# Patient Record
Sex: Female | Born: 1950 | Race: White | Hispanic: No | Marital: Married | Smoking: Never smoker
Health system: Southern US, Community
[De-identification: ages and names within clinical notes are randomized; demographics above are authoritative.]

## PROBLEM LIST (undated history)

## (undated) DIAGNOSIS — Z8739 Personal history of other diseases of the musculoskeletal system and connective tissue: Secondary | ICD-10-CM

## (undated) DIAGNOSIS — H8109 Meniere's disease, unspecified ear: Secondary | ICD-10-CM

## (undated) DIAGNOSIS — G510 Bell's palsy: Secondary | ICD-10-CM

## (undated) DIAGNOSIS — H9319 Tinnitus, unspecified ear: Secondary | ICD-10-CM

## (undated) DIAGNOSIS — M1990 Unspecified osteoarthritis, unspecified site: Secondary | ICD-10-CM

## (undated) DIAGNOSIS — S92901A Unspecified fracture of right foot, initial encounter for closed fracture: Secondary | ICD-10-CM

## (undated) DIAGNOSIS — G709 Myoneural disorder, unspecified: Secondary | ICD-10-CM

## (undated) DIAGNOSIS — R42 Dizziness and giddiness: Secondary | ICD-10-CM

## (undated) DIAGNOSIS — M810 Age-related osteoporosis without current pathological fracture: Secondary | ICD-10-CM

## (undated) DIAGNOSIS — G540 Brachial plexus disorders: Secondary | ICD-10-CM

## (undated) HISTORY — PX: BREAST CYST ASPIRATION: SHX578

## (undated) HISTORY — DX: Age-related osteoporosis without current pathological fracture: M81.0

## (undated) HISTORY — DX: Unspecified fracture of right foot, initial encounter for closed fracture: S92.901A

## (undated) HISTORY — PX: TONSILLECTOMY: SUR1361

## (undated) HISTORY — DX: Brachial plexus disorders: G54.0

## (undated) HISTORY — PX: WISDOM TOOTH EXTRACTION: SHX21

## (undated) HISTORY — DX: Bell's palsy: G51.0

## (undated) HISTORY — DX: Dizziness and giddiness: R42

## (undated) HISTORY — DX: Meniere's disease, unspecified ear: H81.09

---

## 1981-06-14 HISTORY — PX: HERNIA REPAIR: SHX51

## 1990-06-14 DIAGNOSIS — G540 Brachial plexus disorders: Secondary | ICD-10-CM

## 1990-06-14 HISTORY — DX: Brachial plexus disorders: G54.0

## 1993-06-14 DIAGNOSIS — G709 Myoneural disorder, unspecified: Secondary | ICD-10-CM

## 1993-06-14 DIAGNOSIS — S92901A Unspecified fracture of right foot, initial encounter for closed fracture: Secondary | ICD-10-CM

## 1993-06-14 HISTORY — DX: Myoneural disorder, unspecified: G70.9

## 1993-06-14 HISTORY — DX: Unspecified fracture of right foot, initial encounter for closed fracture: S92.901A

## 1999-04-09 ENCOUNTER — Encounter: Payer: Self-pay | Admitting: Obstetrics and Gynecology

## 1999-04-09 ENCOUNTER — Encounter: Admission: RE | Admit: 1999-04-09 | Discharge: 1999-04-09 | Payer: Self-pay | Admitting: Obstetrics and Gynecology

## 1999-08-20 ENCOUNTER — Encounter: Admission: RE | Admit: 1999-08-20 | Discharge: 1999-08-20 | Payer: Self-pay | Admitting: Obstetrics and Gynecology

## 1999-08-20 ENCOUNTER — Encounter: Payer: Self-pay | Admitting: Obstetrics and Gynecology

## 1999-09-29 ENCOUNTER — Encounter: Payer: Self-pay | Admitting: Otolaryngology

## 1999-09-29 ENCOUNTER — Encounter: Admission: RE | Admit: 1999-09-29 | Discharge: 1999-09-29 | Payer: Self-pay | Admitting: Otolaryngology

## 2001-04-13 ENCOUNTER — Encounter: Admission: RE | Admit: 2001-04-13 | Discharge: 2001-04-13 | Payer: Self-pay | Admitting: Obstetrics and Gynecology

## 2001-04-13 ENCOUNTER — Encounter: Payer: Self-pay | Admitting: Obstetrics and Gynecology

## 2001-06-14 HISTORY — PX: WRIST FRACTURE SURGERY: SHX121

## 2002-06-14 HISTORY — PX: ORIF TIBIA FRACTURE: SHX5416

## 2003-04-08 ENCOUNTER — Encounter: Payer: Self-pay | Admitting: Obstetrics and Gynecology

## 2003-04-08 ENCOUNTER — Encounter: Admission: RE | Admit: 2003-04-08 | Discharge: 2003-04-08 | Payer: Self-pay | Admitting: Obstetrics and Gynecology

## 2004-04-10 ENCOUNTER — Encounter: Admission: RE | Admit: 2004-04-10 | Discharge: 2004-04-10 | Payer: Self-pay | Admitting: Obstetrics and Gynecology

## 2005-05-28 ENCOUNTER — Encounter: Admission: RE | Admit: 2005-05-28 | Discharge: 2005-05-28 | Payer: Self-pay | Admitting: Obstetrics and Gynecology

## 2006-07-22 ENCOUNTER — Encounter: Admission: RE | Admit: 2006-07-22 | Discharge: 2006-07-22 | Payer: Self-pay | Admitting: Gastroenterology

## 2008-05-15 ENCOUNTER — Ambulatory Visit: Payer: Self-pay | Admitting: Pulmonary Disease

## 2008-05-15 DIAGNOSIS — R05 Cough: Secondary | ICD-10-CM

## 2008-05-15 DIAGNOSIS — J309 Allergic rhinitis, unspecified: Secondary | ICD-10-CM | POA: Insufficient documentation

## 2008-05-15 DIAGNOSIS — E785 Hyperlipidemia, unspecified: Secondary | ICD-10-CM | POA: Insufficient documentation

## 2008-05-15 DIAGNOSIS — R059 Cough, unspecified: Secondary | ICD-10-CM | POA: Insufficient documentation

## 2008-05-29 ENCOUNTER — Ambulatory Visit: Payer: Self-pay | Admitting: Pulmonary Disease

## 2008-07-15 LAB — HM DEXA SCAN

## 2008-07-23 ENCOUNTER — Other Ambulatory Visit: Admission: RE | Admit: 2008-07-23 | Discharge: 2008-07-23 | Payer: Self-pay | Admitting: Obstetrics and Gynecology

## 2008-08-06 ENCOUNTER — Encounter: Admission: RE | Admit: 2008-08-06 | Discharge: 2008-08-06 | Payer: Self-pay | Admitting: Obstetrics and Gynecology

## 2008-08-13 ENCOUNTER — Encounter: Admission: RE | Admit: 2008-08-13 | Discharge: 2008-08-13 | Payer: Self-pay | Admitting: Obstetrics and Gynecology

## 2009-07-29 ENCOUNTER — Other Ambulatory Visit: Admission: RE | Admit: 2009-07-29 | Discharge: 2009-07-29 | Payer: Self-pay | Admitting: Obstetrics and Gynecology

## 2009-08-15 ENCOUNTER — Encounter: Admission: RE | Admit: 2009-08-15 | Discharge: 2009-08-15 | Payer: Self-pay | Admitting: Obstetrics and Gynecology

## 2010-08-03 ENCOUNTER — Other Ambulatory Visit: Payer: Self-pay | Admitting: Obstetrics and Gynecology

## 2010-08-03 DIAGNOSIS — Z1231 Encounter for screening mammogram for malignant neoplasm of breast: Secondary | ICD-10-CM

## 2010-08-19 ENCOUNTER — Ambulatory Visit
Admission: RE | Admit: 2010-08-19 | Discharge: 2010-08-19 | Disposition: A | Discharge status: Home / Self Care | Payer: BC Managed Care – PPO | Source: Ambulatory Visit | Attending: Obstetrics and Gynecology | Admitting: Obstetrics and Gynecology

## 2010-08-19 DIAGNOSIS — Z1231 Encounter for screening mammogram for malignant neoplasm of breast: Secondary | ICD-10-CM

## 2011-07-20 ENCOUNTER — Other Ambulatory Visit: Payer: Self-pay | Admitting: Obstetrics and Gynecology

## 2011-07-20 DIAGNOSIS — Z1231 Encounter for screening mammogram for malignant neoplasm of breast: Secondary | ICD-10-CM

## 2011-08-20 ENCOUNTER — Ambulatory Visit: Payer: BC Managed Care – PPO

## 2011-08-23 ENCOUNTER — Ambulatory Visit
Admission: RE | Admit: 2011-08-23 | Discharge: 2011-08-23 | Disposition: A | Discharge status: Home / Self Care | Payer: BC Managed Care – PPO | Source: Ambulatory Visit | Attending: Obstetrics and Gynecology | Admitting: Obstetrics and Gynecology

## 2011-08-23 DIAGNOSIS — Z1231 Encounter for screening mammogram for malignant neoplasm of breast: Secondary | ICD-10-CM

## 2011-10-28 DIAGNOSIS — IMO0002 Reserved for concepts with insufficient information to code with codable children: Secondary | ICD-10-CM

## 2011-10-28 DIAGNOSIS — N814 Uterovaginal prolapse, unspecified: Secondary | ICD-10-CM

## 2011-10-28 DIAGNOSIS — N816 Rectocele: Secondary | ICD-10-CM

## 2011-11-18 ENCOUNTER — Other Ambulatory Visit: Payer: Self-pay | Admitting: Urology

## 2011-12-06 ENCOUNTER — Encounter (HOSPITAL_COMMUNITY): Payer: Self-pay | Admitting: Pharmacy Technician

## 2011-12-21 ENCOUNTER — Inpatient Hospital Stay (HOSPITAL_COMMUNITY): Admission: RE | Admit: 2011-12-21 | Payer: BC Managed Care – PPO | Source: Ambulatory Visit

## 2011-12-21 ENCOUNTER — Encounter (HOSPITAL_COMMUNITY): Payer: Self-pay

## 2011-12-21 ENCOUNTER — Encounter (HOSPITAL_COMMUNITY)
Admission: RE | Admit: 2011-12-21 | Discharge: 2011-12-21 | Disposition: A | Discharge status: Home / Self Care | Payer: BC Managed Care – PPO | Source: Ambulatory Visit | Attending: Obstetrics & Gynecology | Admitting: Obstetrics & Gynecology

## 2011-12-21 HISTORY — DX: Unspecified osteoarthritis, unspecified site: M19.90

## 2011-12-21 HISTORY — DX: Personal history of other diseases of the musculoskeletal system and connective tissue: Z87.39

## 2011-12-21 HISTORY — DX: Tinnitus, unspecified ear: H93.19

## 2011-12-21 HISTORY — DX: Myoneural disorder, unspecified: G70.9

## 2011-12-21 LAB — CBC
HCT: 39.3 % (ref 36.0–46.0)
Hemoglobin: 13.1 g/dL (ref 12.0–15.0)
MCH: 27.4 pg (ref 26.0–34.0)
MCHC: 33.3 g/dL (ref 30.0–36.0)
MCV: 82.2 fL (ref 78.0–100.0)
Platelets: 174 10*3/uL (ref 150–400)
RBC: 4.78 MIL/uL (ref 3.87–5.11)
RDW: 15 % (ref 11.5–15.5)
WBC: 3.8 10*3/uL — ABNORMAL LOW (ref 4.0–10.5)

## 2011-12-21 LAB — BASIC METABOLIC PANEL
BUN: 10 mg/dL (ref 6–23)
Chloride: 96 mEq/L (ref 96–112)
Creatinine, Ser: 0.65 mg/dL (ref 0.50–1.10)
GFR calc Af Amer: >90 mL/min (ref >90)
Glucose, Bld: 93 mg/dL (ref 70–99)
Potassium: 4.3 mEq/L (ref 3.5–5.1)

## 2011-12-21 NOTE — Patient Instructions (Addendum)
   Your procedure is scheduled on:  Tuesday, 12/28/11  Enter through the Main Entrance of [facility] at: 6:00am Pick up the phone at the desk and dial [phone] and inform us of your arrival.  Please call this number if you have any problems the morning of surgery: [phone]  Remember: Do not eat food after midnight: Monday Do not drink clear liquids after: Monday Take these medicines the morning of surgery with a SIP OF WATER: None  Do not wear jewelry, make-up, or FINGER nail polish No metal in your hair or on your body. Do not wear lotions, powders, perfumes or deodorant. Do not shave 48 hours prior to surgery. Do not bring valuables to the hospital. Contacts, dentures or bridgework may not be worn into surgery.  Leave suitcase in the car. After Surgery it may be brought to your room. For patients being admitted to the hospital, checkout time is 11:00am the day of discharge.  Home with Husband Annette Stable cell [phone]  Patients discharged on the day of surgery will not be allowed to drive home.     Remember to use your hibiclens as instructed.Please shower with 1/2 bottle the evening before your surgery and the other 1/2 bottle the morning of surgery. Neck down avoiding private area.

## 2011-12-26 NOTE — H&P (Signed)
History of Present Illness   Tracey Schneider has pelvic organ prolapse. She has an urgent bladder but is continent. Dyspareunia was improved with Premarin cream. Her initial residual was 103 mL. Her initial pelvic examination demonstrated small grade 2 cystocele that just reached the introitus. Her cervix descended 3 cm. I thought if she had surgery she would best benefit from a transvaginal hysterectomy with cystocele repair and graft and probably vault suspension and release the 4-corner graft. She was here to discuss her urodynamics. Review of systems: No change in bowel or neurologic status.   Urinalysis: I reviewed, negative. She did not void and was catheterized for 60 mL. Her maximum capacity was 884 mL. Her bladder was stable. She did not leak with a Valsalva pressure of 126 cm of water. During voiding she voided 147 mL with a maximum flow of 7 mL per second. She did not generate a detrusor contraction. She voided by straining. Her residual was 740 mL. EMG activity showed typical artifact with straining. She had a large capacity hyposensitive bladder. The details of the urodynamics are signed and dictated on the urodynamic sheet.    Past Medical History Problems  1. History of  Arthritis V13.4  Surgical History Problems  1. History of  Hernia Repair  Current Meds 1. Amitriptyline HCl 25 MG Oral Tablet; TAKE 0.5 TABLET Daily; Therapy: (Recorded:12Mar2012) to 2. Caltrate 600 + D 600-200 MG-IU TABS; Therapy: (Recorded:12Mar2012) to 3. Chlorpheniramine Maleate ER TBCR; Therapy: (Recorded:12Mar2012) to 4. Chlorthalidone 25 MG Oral Tablet; Therapy: (Recorded:12Mar2012) to 5. Estrace CREA; Therapy: (Recorded:12Mar2012) to 6. Fosamax 70 MG Oral Tablet; Therapy: (Recorded:12Mar2012) to 7. Multi-Vitamin Oral Tablet; Therapy: (Recorded:12Mar2012) to 8. Potassium Chloride 10 MEQ TBCR; Therapy: (Recorded:12Mar2012) to 9. Probiotic CAPS; Therapy: (Recorded:12Mar2012) to 10. Vitamin D TABS; Therapy:  (Recorded:12Mar2012) to  Allergies Medication  1. Sulfa Drugs  Family History Problems  1. Paternal history of  Family Health Status Number Of Children 1 son & 1 daughter 2. Paternal history of  Malignant Melanoma Of The Skin V16.8 3. Maternal history of  Peripheral Vascular Disease  Social History Problems  1. Activities Of Daily Living 2. Caffeine Use 2-coffee; 6-tea 3. Exercise Habits actively exercising at the gym at least twice a week with cardio activities for 30 minutes and resistive training as well. 4. Living Independently With Spouse 5. Marital History - Currently Married 6. Self-reliant In Usual Daily Activities Denied  7. History of  Alcohol Use 8. History of  Tobacco Use  Results/Data Urine [Data Includes: Last 1 Day]   06Jun2013  COLOR YELLOW   APPEARANCE CLEAR   SPECIFIC GRAVITY 1.015   pH 6.0   GLUCOSE NEG mg/dL  BILIRUBIN NEG   KETONE NEG mg/dL  BLOOD NEG   PROTEIN NEG mg/dL  UROBILINOGEN 0.2 mg/dL  NITRITE NEG   LEUKOCYTE ESTERASE NEG    Assessment Assessed  1. Feelings Of Urinary Urgency 788.63 2. Cystocele 596.89  Plan Cystocele (596.89)  1. Follow-up Schedule Surgery Office  Follow-up  Requested for: 06Jun2013  Discussion/Summary   I drew Ms. Mcneese a picture. I went over a cystocele repair and probable vault suspension and release of 4-corner graft. Watchful waiting versus pessary were discussed.  I drew her a picture and we talked about prolapse surgery in detail. Pros, cons, general surgical and anesthetic risks, and other options including behavioral therapy, pessaries, and watchful waiting were discussed. She understands that prolapse repairs are successful in 80-85% of cases for prolapse symptoms and can recur anteriorly, posteriorly, and/or  apically. She understands that in most cases I use a graft and general risks were discussed. Surgical risks were described but not limited to the discussion of injury to neighboring structures  including the bowel (with possible life-threatening sepsis and colostomy), bladder, urethra, vagina (all resulting in further surgery), and ureter (resulting in re-implantation). We talked about injury to nerves/soft tissue leading to debilitating and intractable pelvic, abdominal, and lower extremity pain syndromes and neuropathies. The risks of buttock pain, intractable dyspareunia, and vaginal narrowing and shortening with sequelae were discussed. Bleeding risks, transfusion rates, and infection were discussed. The risk of persistent, de novo, or worsening bladder and/or bowel incontinence/dysfunction was discussed. The need for CIC was described as well the usual post-operative course. The patient understands that she might not reach her treatment goal and that she might be worse following surgery.  I did not recommend a sling. The chance of worsening incontinence was discussed.   Ms. Cowman is scheduled and would like to proceed with surgery.  After a thorough review of the management options for the patient's condition the patient  elected to proceed with surgical therapy as noted above. We have discussed the potential benefits and risks of the procedure, side effects of the proposed treatment, the likelihood of the patient achieving the goals of the procedure, and any potential problems that might occur during the procedure or recuperation. Informed consent has been obtained.

## 2011-12-27 MED ORDER — GENTAMICIN SULFATE 40 MG/ML IJ SOLN
5.0000 mg/kg | INTRAMUSCULAR | Status: AC
  Administered 2011-12-28: 370.4 mg via INTRAVENOUS
  Filled 2011-12-27: qty 9.26

## 2011-12-28 ENCOUNTER — Encounter (HOSPITAL_COMMUNITY): Payer: Self-pay | Admitting: Anesthesiology

## 2011-12-28 ENCOUNTER — Ambulatory Visit (HOSPITAL_COMMUNITY)
Admission: RE | Admit: 2011-12-28 | Discharge: 2011-12-29 | Disposition: A | Discharge status: Home / Self Care | Payer: BC Managed Care – PPO | Source: Ambulatory Visit | Attending: Obstetrics & Gynecology | Admitting: Obstetrics & Gynecology

## 2011-12-28 ENCOUNTER — Ambulatory Visit (HOSPITAL_COMMUNITY): Payer: BC Managed Care – PPO | Admitting: Anesthesiology

## 2011-12-28 ENCOUNTER — Encounter (HOSPITAL_COMMUNITY): Payer: Self-pay | Admitting: *Deleted

## 2011-12-28 ENCOUNTER — Encounter (HOSPITAL_COMMUNITY)
Admission: RE | Disposition: A | Discharge status: Home / Self Care | Payer: Self-pay | Source: Ambulatory Visit | Attending: Obstetrics & Gynecology

## 2011-12-28 DIAGNOSIS — N816 Rectocele: Secondary | ICD-10-CM

## 2011-12-28 DIAGNOSIS — IMO0002 Reserved for concepts with insufficient information to code with codable children: Secondary | ICD-10-CM

## 2011-12-28 DIAGNOSIS — N840 Polyp of corpus uteri: Secondary | ICD-10-CM | POA: Insufficient documentation

## 2011-12-28 DIAGNOSIS — N8000 Endometriosis of the uterus, unspecified: Secondary | ICD-10-CM | POA: Insufficient documentation

## 2011-12-28 DIAGNOSIS — N814 Uterovaginal prolapse, unspecified: Secondary | ICD-10-CM

## 2011-12-28 DIAGNOSIS — N812 Incomplete uterovaginal prolapse: Principal | ICD-10-CM | POA: Insufficient documentation

## 2011-12-28 DIAGNOSIS — N838 Other noninflammatory disorders of ovary, fallopian tube and broad ligament: Secondary | ICD-10-CM | POA: Insufficient documentation

## 2011-12-28 HISTORY — PX: CYSTOSCOPY: SHX5120

## 2011-12-28 HISTORY — PX: CYSTOCELE REPAIR: SHX163

## 2011-12-28 HISTORY — PX: SALPINGOOPHORECTOMY: SHX82

## 2011-12-28 HISTORY — PX: LAPAROSCOPIC ASSISTED VAGINAL HYSTERECTOMY: SHX5398

## 2011-12-28 SURGERY — HYSTERECTOMY, VAGINAL, LAPAROSCOPY-ASSISTED
Anesthesia: General | Site: Vagina

## 2011-12-28 MED ORDER — DIPHENHYDRAMINE HCL 50 MG/ML IJ SOLN: 12.5000 mg | Freq: Four times a day (QID) | INTRAMUSCULAR | Status: DC | PRN

## 2011-12-28 MED ORDER — PROPOFOL 10 MG/ML IV EMUL
INTRAVENOUS | Status: DC | PRN
  Administered 2011-12-28: 160 mg via INTRAVENOUS

## 2011-12-28 MED ORDER — GLYCOPYRROLATE 0.2 MG/ML IJ SOLN
INTRAMUSCULAR | Status: AC
  Filled 2011-12-28: qty 1

## 2011-12-28 MED ORDER — ONDANSETRON HCL 4 MG/2ML IJ SOLN
INTRAMUSCULAR | Status: DC | PRN
  Administered 2011-12-28: 4 mg via INTRAVENOUS

## 2011-12-28 MED ORDER — FENTANYL CITRATE 0.05 MG/ML IJ SOLN
INTRAMUSCULAR | Status: AC
  Filled 2011-12-28: qty 15

## 2011-12-28 MED ORDER — GLYCOPYRROLATE 0.2 MG/ML IJ SOLN
INTRAMUSCULAR | Status: DC | PRN
  Administered 2011-12-28 (×2): 0.3 mg via INTRAVENOUS

## 2011-12-28 MED ORDER — FENTANYL CITRATE 0.05 MG/ML IJ SOLN
INTRAMUSCULAR | Status: DC | PRN
  Administered 2011-12-28: 25 ug via INTRAVENOUS
  Administered 2011-12-28 (×5): 50 ug via INTRAVENOUS
  Administered 2011-12-28: 25 ug via INTRAVENOUS
  Administered 2011-12-28: 50 ug via INTRAVENOUS

## 2011-12-28 MED ORDER — LIDOCAINE HCL (CARDIAC) 20 MG/ML IV SOLN
INTRAVENOUS | Status: DC | PRN
  Administered 2011-12-28 (×2): 30 mg via INTRAVENOUS

## 2011-12-28 MED ORDER — DEXTROSE-NACL 5-0.45 % IV SOLN
INTRAVENOUS | Status: DC
  Administered 2011-12-28 – 2011-12-29 (×3): via INTRAVENOUS

## 2011-12-28 MED ORDER — CEFAZOLIN SODIUM-DEXTROSE 2-3 GM-% IV SOLR: 2.0000 g | INTRAVENOUS | Status: DC

## 2011-12-28 MED ORDER — HYDROMORPHONE HCL 2 MG PO TABS: 2.0000 mg | ORAL_TABLET | ORAL | Status: DC | PRN

## 2011-12-28 MED ORDER — SODIUM CHLORIDE 0.9 % IR SOLN
Freq: Once | Status: DC
  Filled 2011-12-28: qty 1

## 2011-12-28 MED ORDER — ONDANSETRON HCL 4 MG/2ML IJ SOLN
4.0000 mg | Freq: Four times a day (QID) | INTRAMUSCULAR | Status: DC | PRN
  Administered 2011-12-28: 4 mg via INTRAVENOUS
  Filled 2011-12-28: qty 2

## 2011-12-28 MED ORDER — DEXAMETHASONE SODIUM PHOSPHATE 10 MG/ML IJ SOLN
INTRAMUSCULAR | Status: AC
  Filled 2011-12-28: qty 1

## 2011-12-28 MED ORDER — LACTATED RINGERS IR SOLN
Status: DC | PRN
  Administered 2011-12-28: 3000 mL

## 2011-12-28 MED ORDER — INDIGOTINDISULFONATE SODIUM 8 MG/ML IJ SOLN
INTRAMUSCULAR | Status: DC | PRN
  Administered 2011-12-28: 40 mg via INTRAVENOUS

## 2011-12-28 MED ORDER — AMITRIPTYLINE HCL 25 MG PO TABS
12.5000 mg | ORAL_TABLET | Freq: Every day | ORAL | Status: DC
  Filled 2011-12-28 (×2): qty 0.5

## 2011-12-28 MED ORDER — PROMETHAZINE HCL 25 MG/ML IJ SOLN
6.2500 mg | INTRAMUSCULAR | Status: DC | PRN
  Administered 2011-12-28: 6.25 mg via INTRAVENOUS

## 2011-12-28 MED ORDER — KETOROLAC TROMETHAMINE 30 MG/ML IJ SOLN: 15.0000 mg | Freq: Once | INTRAMUSCULAR | Status: DC | PRN

## 2011-12-28 MED ORDER — NEOSTIGMINE METHYLSULFATE 1 MG/ML IJ SOLN
INTRAMUSCULAR | Status: AC
  Filled 2011-12-28: qty 10

## 2011-12-28 MED ORDER — ROPIVACAINE HCL 5 MG/ML IJ SOLN
INTRAMUSCULAR | Status: DC | PRN
  Administered 2011-12-28: 30 mL

## 2011-12-28 MED ORDER — INDIGOTINDISULFONATE SODIUM 8 MG/ML IJ SOLN
INTRAMUSCULAR | Status: AC
  Filled 2011-12-28: qty 5

## 2011-12-28 MED ORDER — NALOXONE HCL 0.4 MG/ML IJ SOLN: 0.4000 mg | INTRAMUSCULAR | Status: DC | PRN

## 2011-12-28 MED ORDER — PROPOFOL 10 MG/ML IV EMUL
INTRAVENOUS | Status: AC
  Filled 2011-12-28: qty 20

## 2011-12-28 MED ORDER — ALUM & MAG HYDROXIDE-SIMETH 200-200-20 MG/5ML PO SUSP: 30.0000 mL | ORAL | Status: DC | PRN

## 2011-12-28 MED ORDER — MIDAZOLAM HCL 2 MG/2ML IJ SOLN
INTRAMUSCULAR | Status: AC
  Filled 2011-12-28: qty 2

## 2011-12-28 MED ORDER — PHENYLEPHRINE 40 MCG/ML (10ML) SYRINGE FOR IV PUSH (FOR BLOOD PRESSURE SUPPORT)
PREFILLED_SYRINGE | INTRAVENOUS | Status: AC
  Filled 2011-12-28: qty 5

## 2011-12-28 MED ORDER — EPHEDRINE SULFATE 50 MG/ML IJ SOLN
INTRAMUSCULAR | Status: DC | PRN
  Administered 2011-12-28 (×2): 10 mg via INTRAVENOUS
  Administered 2011-12-28 (×2): 15 mg via INTRAVENOUS

## 2011-12-28 MED ORDER — ROCURONIUM BROMIDE 50 MG/5ML IV SOLN
INTRAVENOUS | Status: AC
  Filled 2011-12-28: qty 1

## 2011-12-28 MED ORDER — LIDOCAINE-EPINEPHRINE (PF) 1 %-1:200000 IJ SOLN
INTRAMUSCULAR | Status: AC
  Filled 2011-12-28: qty 10

## 2011-12-28 MED ORDER — DEXAMETHASONE SODIUM PHOSPHATE 4 MG/ML IJ SOLN
INTRAMUSCULAR | Status: DC | PRN
  Administered 2011-12-28: 10 mg via INTRAVENOUS

## 2011-12-28 MED ORDER — MEPERIDINE HCL 25 MG/ML IJ SOLN: 6.2500 mg | INTRAMUSCULAR | Status: DC | PRN

## 2011-12-28 MED ORDER — LIDOCAINE HCL (CARDIAC) 20 MG/ML IV SOLN
INTRAVENOUS | Status: AC
  Filled 2011-12-28: qty 5

## 2011-12-28 MED ORDER — ROCURONIUM BROMIDE 100 MG/10ML IV SOLN
INTRAVENOUS | Status: DC | PRN
  Administered 2011-12-28: 10 mg via INTRAVENOUS
  Administered 2011-12-28: 40 mg via INTRAVENOUS

## 2011-12-28 MED ORDER — TEMAZEPAM 15 MG PO CAPS: 15.0000 mg | ORAL_CAPSULE | Freq: Every evening | ORAL | Status: DC | PRN

## 2011-12-28 MED ORDER — BUPIVACAINE HCL (PF) 0.25 % IJ SOLN
INTRAMUSCULAR | Status: AC
  Filled 2011-12-28: qty 30

## 2011-12-28 MED ORDER — ONDANSETRON HCL 4 MG/2ML IJ SOLN
INTRAMUSCULAR | Status: AC
  Filled 2011-12-28: qty 2

## 2011-12-28 MED ORDER — BUPIVACAINE HCL (PF) 0.25 % IJ SOLN
INTRAMUSCULAR | Status: DC | PRN
  Administered 2011-12-28: 8 mL

## 2011-12-28 MED ORDER — ROPIVACAINE HCL 5 MG/ML IJ SOLN
INTRAMUSCULAR | Status: AC
  Filled 2011-12-28: qty 30

## 2011-12-28 MED ORDER — PHENYLEPHRINE HCL 10 MG/ML IJ SOLN
INTRAMUSCULAR | Status: DC | PRN
  Administered 2011-12-28 (×2): 40 ug via INTRAVENOUS
  Administered 2011-12-28: 80 ug via INTRAVENOUS
  Administered 2011-12-28 (×2): 40 ug via INTRAVENOUS
  Administered 2011-12-28: 80 ug via INTRAVENOUS

## 2011-12-28 MED ORDER — EPHEDRINE 5 MG/ML INJ
INTRAVENOUS | Status: AC
  Filled 2011-12-28: qty 10

## 2011-12-28 MED ORDER — MIDAZOLAM HCL 5 MG/5ML IJ SOLN
INTRAMUSCULAR | Status: DC | PRN
  Administered 2011-12-28 (×2): 1 mg via INTRAVENOUS

## 2011-12-28 MED ORDER — FENTANYL CITRATE 0.05 MG/ML IJ SOLN
INTRAMUSCULAR | Status: AC
  Filled 2011-12-28: qty 2

## 2011-12-28 MED ORDER — HYDROMORPHONE 0.3 MG/ML IV SOLN
INTRAVENOUS | Status: DC
  Administered 2011-12-28: 12:00:00 via INTRAVENOUS
  Administered 2011-12-28: 0.2 mg via INTRAVENOUS
  Filled 2011-12-28: qty 25

## 2011-12-28 MED ORDER — KETOROLAC TROMETHAMINE 30 MG/ML IJ SOLN
INTRAMUSCULAR | Status: DC | PRN
  Administered 2011-12-28: 30 mg via INTRAVENOUS

## 2011-12-28 MED ORDER — SIMETHICONE 80 MG PO CHEW: 80.0000 mg | CHEWABLE_TABLET | Freq: Four times a day (QID) | ORAL | Status: DC | PRN

## 2011-12-28 MED ORDER — ESTRADIOL 0.1 MG/GM VA CREA
TOPICAL_CREAM | VAGINAL | Status: AC
  Filled 2011-12-28: qty 42.5

## 2011-12-28 MED ORDER — FENTANYL CITRATE 0.05 MG/ML IJ SOLN: 25.0000 ug | INTRAMUSCULAR | Status: DC | PRN

## 2011-12-28 MED ORDER — PROMETHAZINE HCL 25 MG/ML IJ SOLN
12.5000 mg | Freq: Four times a day (QID) | INTRAMUSCULAR | Status: DC | PRN
  Administered 2011-12-28: 12.5 mg via INTRAVENOUS
  Filled 2011-12-28: qty 1

## 2011-12-28 MED ORDER — PROMETHAZINE HCL 25 MG/ML IJ SOLN
INTRAMUSCULAR | Status: AC
  Administered 2011-12-28: 6.25 mg via INTRAVENOUS
  Filled 2011-12-28: qty 1

## 2011-12-28 MED ORDER — OXYCODONE-ACETAMINOPHEN 5-325 MG PO TABS: 1.0000 | ORAL_TABLET | ORAL | Status: DC | PRN

## 2011-12-28 MED ORDER — PANTOPRAZOLE SODIUM 40 MG IV SOLR
40.0000 mg | Freq: Every day | INTRAVENOUS | Status: DC
  Administered 2011-12-28: 40 mg via INTRAVENOUS
  Filled 2011-12-28 (×2): qty 40

## 2011-12-28 MED ORDER — DIPHENHYDRAMINE HCL 12.5 MG/5ML PO ELIX: 12.5000 mg | ORAL_SOLUTION | Freq: Four times a day (QID) | ORAL | Status: DC | PRN

## 2011-12-28 MED ORDER — LIDOCAINE-EPINEPHRINE 1 %-1:100000 IJ SOLN
INTRAMUSCULAR | Status: DC | PRN
  Administered 2011-12-28: 10 mL

## 2011-12-28 MED ORDER — LACTATED RINGERS IV SOLN
INTRAVENOUS | Status: DC
  Administered 2011-12-28 (×3): via INTRAVENOUS

## 2011-12-28 MED ORDER — SODIUM CHLORIDE 0.9 % IJ SOLN: 9.0000 mL | INTRAMUSCULAR | Status: DC | PRN

## 2011-12-28 MED ORDER — MENTHOL 3 MG MT LOZG: 1.0000 | LOZENGE | OROMUCOSAL | Status: DC | PRN

## 2011-12-28 MED ORDER — CEFAZOLIN SODIUM-DEXTROSE 2-3 GM-% IV SOLR
INTRAVENOUS | Status: AC
  Administered 2011-12-28: 2 g via INTRAVENOUS
  Filled 2011-12-28: qty 50

## 2011-12-28 MED ORDER — ACETAMINOPHEN 325 MG PO TABS: 650.0000 mg | ORAL_TABLET | ORAL | Status: DC | PRN

## 2011-12-28 SURGICAL SUPPLY — 70 items
ADH SKN CLS APL DERMABOND .7 (GAUZE/BANDAGES/DRESSINGS) ×4
BLADE SURG 15 STRL LF C SS BP (BLADE) ×4 IMPLANT
BLADE SURG 15 STRL SS (BLADE) ×5
CABLE HIGH FREQUENCY MONO STRZ (ELECTRODE) ×1 IMPLANT
CANISTER SUCTION 2500CC (MISCELLANEOUS) ×5 IMPLANT
CHLORAPREP W/TINT 26ML (MISCELLANEOUS) ×5 IMPLANT
CLOTH BEACON ORANGE TIMEOUT ST (SAFETY) ×5 IMPLANT
CONT PATH 16OZ SNAP LID 3702 (MISCELLANEOUS) ×5 IMPLANT
COVER TABLE BACK 60X90 (DRAPES) ×5 IMPLANT
DECANTER SPIKE VIAL GLASS SM (MISCELLANEOUS) ×2 IMPLANT
DERMABOND ADVANCED (GAUZE/BANDAGES/DRESSINGS) ×1
DERMABOND ADVANCED .7 DNX12 (GAUZE/BANDAGES/DRESSINGS) ×4 IMPLANT
DEVICE CAPIO SUTURING (INSTRUMENTS)
DEVICE CAPIO SUTURING OPC (INSTRUMENTS) IMPLANT
DRAIN PENROSE 1/4X12 LTX (DRAIN) ×5 IMPLANT
ELECT REM PT RETURN 9FT ADLT (ELECTROSURGICAL) ×5
ELECTRODE REM PT RTRN 9FT ADLT (ELECTROSURGICAL) ×4 IMPLANT
EVACUATOR SMOKE 8.L (FILTER) ×5 IMPLANT
GAUZE PACKING 2X5 YD STERILE (GAUZE/BANDAGES/DRESSINGS) ×1 IMPLANT
GAUZE SPONGE 4X4 16PLY XRAY LF (GAUZE/BANDAGES/DRESSINGS) ×9 IMPLANT
GLOVE BIO SURGEON STRL SZ7.5 (GLOVE) ×10 IMPLANT
GLOVE BIO SURGEON STRL SZ8 (GLOVE) ×5 IMPLANT
GLOVE BIOGEL PI IND STRL 7.0 (GLOVE) ×8 IMPLANT
GLOVE BIOGEL PI INDICATOR 7.0 (GLOVE) ×5
GLOVE ECLIPSE 6.5 STRL STRAW (GLOVE) ×17 IMPLANT
GOWN PREVENTION PLUS LG XLONG (DISPOSABLE) ×28 IMPLANT
GOWN STRL REIN XL XLG (GOWN DISPOSABLE) ×8 IMPLANT
NDL INSUFFLATION 14GA 120MM (NEEDLE) ×4 IMPLANT
NDL MAYO 6 CRC TAPER PT (NEEDLE) IMPLANT
NEEDLE HYPO 22GX1.5 SAFETY (NEEDLE) ×5 IMPLANT
NEEDLE INSUFFLATION 14GA 120MM (NEEDLE) ×5 IMPLANT
NEEDLE MAYO 6 CRC TAPER PT (NEEDLE) ×5 IMPLANT
NS IRRIG 1000ML POUR BTL (IV SOLUTION) ×10 IMPLANT
PACK LAVH (CUSTOM PROCEDURE TRAY) ×5 IMPLANT
PLUG CATH AND CAP STER (CATHETERS) ×5 IMPLANT
PROTECTOR NERVE ULNAR (MISCELLANEOUS) ×6 IMPLANT
RETRACTOR STAY HOOK 5MM (MISCELLANEOUS) ×5 IMPLANT
SCISSORS LAP 5X35 DISP (ENDOMECHANICALS) ×1 IMPLANT
SEALER TISSUE G2 CVD JAW 35 (ENDOMECHANICALS) IMPLANT
SEALER TISSUE G2 CVD JAW 45CM (ENDOMECHANICALS) ×1
SET CYSTO W/LG BORE CLAMP LF (SET/KITS/TRAYS/PACK) ×5 IMPLANT
SET IRRIG TUBING LAPAROSCOPIC (IRRIGATION / IRRIGATOR) ×1 IMPLANT
SUT CAPIO ETHIBPND (SUTURE) IMPLANT
SUT VIC AB 0 CT1 18XCR BRD8 (SUTURE) ×12 IMPLANT
SUT VIC AB 0 CT1 27 (SUTURE) ×5
SUT VIC AB 0 CT1 27XBRD ANBCTR (SUTURE) IMPLANT
SUT VIC AB 0 CT1 36 (SUTURE) ×9 IMPLANT
SUT VIC AB 0 CT1 8-18 (SUTURE) ×10
SUT VIC AB 0 CT2 27 (SUTURE) ×2 IMPLANT
SUT VIC AB 2-0 CT1 (SUTURE) ×1 IMPLANT
SUT VIC AB 2-0 SH 27 (SUTURE) ×10
SUT VIC AB 2-0 SH 27XBRD (SUTURE) ×4 IMPLANT
SUT VIC AB 3-0 PS2 18 (SUTURE) ×10
SUT VIC AB 3-0 PS2 18XBRD (SUTURE) ×4 IMPLANT
SUT VICRYL 0 TIES 12 18 (SUTURE) ×5 IMPLANT
SUT VICRYL 0 UR6 27IN ABS (SUTURE) ×5 IMPLANT
SYR 30ML LL (SYRINGE) ×5 IMPLANT
SYR 50ML LL SCALE MARK (SYRINGE) ×1 IMPLANT
SYR BULB IRRIGATION 50ML (SYRINGE) ×5 IMPLANT
SYR CONTROL 10ML LL (SYRINGE) ×1 IMPLANT
SYRINGE 10CC LL (SYRINGE) ×1 IMPLANT
TIP UTERINE 6.7X8CM BLUE DISP (MISCELLANEOUS) ×1 IMPLANT
TOWEL OR 17X24 6PK STRL BLUE (TOWEL DISPOSABLE) ×19 IMPLANT
TRAY FOLEY CATH 14FR (SET/KITS/TRAYS/PACK) ×9 IMPLANT
TROCAR XCEL NON-BLD 11X100MML (ENDOMECHANICALS) ×5 IMPLANT
TROCAR XCEL NON-BLD 5MMX100MML (ENDOMECHANICALS) ×10 IMPLANT
TUBING CONNECTING 10 (TUBING) ×5 IMPLANT
TUBING NON-CON 1/4 X 20 CONN (TUBING) ×5 IMPLANT
WARMER LAPAROSCOPE (MISCELLANEOUS) ×5 IMPLANT
WATER STERILE IRR 1000ML POUR (IV SOLUTION) ×10 IMPLANT

## 2011-12-28 NOTE — Op Note (Signed)
Preoperative diagnosis: Cystocele Postoperative diagnosis cystocele Surgery: Cystocele repair plus cystoscopy Surgeon: Dr. Lorin Picket Anisa Leanos Asst: Pecola Leisure  The patient had mild loss of uterine support and cuff support and a grade 2 cystocele. Dr. Hyacinth Meeker performed a hysterectomy and ran the posterior cuff. The ureteral sacral ligaments and cuff were very well supported and she a small grade 2 cystocele with a mild trapdoor deformity. She did very narrow vagina. She than short anterior vaginal wall  Preoperative antibodies were given. Leg position was good  I instilled 20 cc of a lidocaine epinephrine mixture submucosally with my usual technique. With Allis clamps and made a long anterior vaginal wall incision from the vaginal cuff to the proximal bladder neck. I sharply and bluntly dissected the overlying anterior vaginal wall from the underlying pubocervical fascia to the white line bilaterally. I was very pleased with this dissection. I did a 2 layer anterior repair not distorting the anatomy. She good fascia  Cystoscopically she had excellent reduction of her cystocele with excellent blue jets bilaterally and no distortion of the trigone. There was no bladder injury  She had a very narrow vagina and there is no question based upon her mild defect she did not require a supporting graft. Sewing the apex to the vaginal cuff further reduce any mild trapdoor deformity.  I trimmed an appropriate amount of anterior vaginal mucosa. I closed the apex from right to left and left to right with 0 Vicryl. She had excellent length. She no narrowing. Blood loss was less than 50 mL.  Vaginal pack with Estrace cream was applied.

## 2011-12-28 NOTE — Op Note (Signed)
12/28/2011  9:34 AM  PATIENT:  Tracey Schneider  61 y.o. 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 Jean Rosenthal oversaw the case  ESTIMATED BLOOD LOSS:75cc  BLOOD ADMINISTERED:none   FLUIDS: 2000cc LR  UOP: 150cc clear UOP  SPECIMEN:  Uterus, cervix, bilateral tubes and ovaries  DISPOSITION OF SPECIMEN:  PATHOLOGY  FINDINGS: normal pelvis, significant bladder prolapse, two areas of omental adhesions, normal liver edge, gall bladder was not decompressed, normal stomach edge  DESCRIPTION OF OPERATION: Patient was taken to the operating room. She is placed in the supine position. General endotracheal anesthesia was administered by the anesthesia staff without difficulty. Dr. Cristela Blue oversaw case. The arms were tucked by her side. She did have a running IV in the left arm which was functioning properly. Legs were positioned in the low lithotomy position in Crosspointe stirrups. She had SCDs on her lower extremities bilaterally and these were functioning properly. A timeout was performed.  Chlor prep was used to prep the abdomen. Betadine was used to prep the perineum, inner thighs, and vagina x3.  After 3 minutes had past the patient was draped in a normal standard fashion. Legs are lifted to the high lithotomy position and attention was turned the vagina. A heavy weighted speculum was placed in the posterior fornix of the vagina a single-tooth tenaculum was used to grasp the anterior lip of the cervix. The uterus sounded to 8 cm. A RUMI uterine manipulator was obtained. A #8  disposable tip was attached and a medium KOH ring was attached. The cervix is dilated up to #21 [facility] dilator. The RUMI uterine manipulator with KOH ring attached was passes cervical canal and into the endometrial cavity. 10 cc of normal saline was used to inflate the bulb which insured a good fit of the KOH ring around the cervix. The tenaculum and heavy weighted speculum was removed.  The Foley was placed to straight drain after being inserted into the bladder  Legs were lowered to the low lithotomy position and attention was turned abdomen. A Veress needle was obtained. The stopcock was open. The abdomen was elevated and the needle was passed into the abdomen without difficulty. The peritoneum was felt as a pop when it was passed. A syringe of normal saline was attached to the needle. An aspiration was performed. No blood or fluid was noted. Fluid injected easily into the needle. A second aspiration was performed without any blood, fluid, or saline noted. Finally fluid dripped easily into the needle. Then under low flows of CO2 gas a pneumoperitoneum was achieved. Once 3 L of gas was in the abdomen the needle was removed. A 10 mm non-bladed trocar port with Optiview tip is attached to the laparoscope this was advanced directly into the abdomen. Then with visualization of the laparoscope port site locations were chosen for the right and left lower corner. The skin was anesthetized with quarter percent Marcaine as was the umbilical incision. 5 mm skin incisions were made with a #11 blade. Then 5 mm non-bladed trochars and ports were passed into the pelvis with direct visualization laparoscope. The patient was then placed in Trendelenburg positioning.  The ureters were noted bilaterally. With the  uterus on stretch to the right, the right IP ligament was serially clamped cauterized and incised with the EnSeal device. The right round ligament was serially clamped cauterized and incised. The tissue between these 2  pedicles was serially clamped cauterized and incised. Then using monopolar cautery attached and endoscopic scissors, the inferior leaf of the broad ligament was opened and the anterior peritoneum was opened down to the level of the internal os of the cervix. The posterior peritoneum was taken down to the level of the uterosacral ligament on the right side. The uterine artery this point was visualized. There was not cauterized or clamped as that would be done vaginally. Then in a similar fashion with uterus on stretch to the right the left IP ligament was serially clamped cauterized and incised. The round ligament on the left side was serially clamped cauterized and incised. The tissue in-between was clamped cauterized and incised. Then using the monopolar scissors the inferiorly for the broad ligament was opened and the peritoneum anteriorly was taken across to meet with the prior dissection on the right side. The posterior peritoneum was taken down to the level of the uterosacral ligament. At this point all pedicles were hemostatic procedure laparoscopically was ended. His meds were removed under direct visualization of the laparoscope. The 2 inferior ports were then removed under direct visualization of the laparoscope. The pneumoperitoneum was relieved. The midline port was removed.  The patient was taken out of Trendelenburg positioning. The fascia was closed in the midline with figure-of-eight suture of #0 Vicryl. The skin was closed at the 3 sites with subcuticular stitches of 3-0 Vicryl. The skin was cleansed and Dermabond was used to make the incisions hemostatic.   Legs were then lifted to the high lithotomy position and attention was turned the vagina. The RUMI uterine manipulator and KOH ring were removed. A heavy weighted speculum was placed in the posterior vaginal fornix. A Jacobs tenaculum was used to grass anterior lip of the cervix. The posterior vaginal mucosa and posterior peritoneum were  entered sharply. A figure-of-eight suture was used to attach posterior peritoneum posterior vaginal mucosa. Then a Bonnano speculum was placed through this incision. The uterosacral ligaments on each side were clamped with a Heaney clamp, incised, and suture-ligated with #0 Vicryl. Then anteriorly on the cervix 10 cc of 1% lidocaine mixed one-to-one with epinephrine (1:100,000 units) was instilled.  Using her 15 blade the anterior mucosa on the cervix was incised and then curved Mayo scissors were used to cut perpendicular to the cervix to begin the dissection of the pubovesicocervical fascia. The bladder pillars were incised. Using an open Ray-Tec the plane between the bladder and the cervix was easily identified. The prior peritoneal incision made laparoscopically was met and a curved Deaver was placed in this incision.  Cardinal ligaments on each side were then serially clamped, incised, and suture-ligated with #0 Vicryl. This was done alternating on the right left side of the cervix. Then the uterine arteries on each side were clamped incised and doubly suture-ligated #0 Vicryl. Then the remaining tissue between the vaginal laparoscopic portion of the procedure was clamped cut and suture-ligated with #0 Vicryl. History the cervix and the uterus which was delivered to the vagina. The tubes and ovaries were intact. The pedicles were hemostatic at this point. The speculum was replaced with a heavy weighted speculum. Vaginal cuff was run from the 3:00 to the 9:00 positions with a #0 Vicryl in a running interlocking fashion. A stitch incorporate the  posterior vaginal mucosa and posterior peritoneum. Then a uterosacral ligament stitch was placed in passing a #0 Vicryl suture through the posterior vaginal mucosa and posterior peritoneum, incorporating the medial third of the left uterosacral ligament, reefing across the posterior peritoneum, and incorporating the medial third of the right uterosacral ligament. The  stitches and delivered through the posterior vaginal mucosa and posterior peritoneum and tied tightly bring the uterosacral ligaments together.   At this point sponge, laps, instruments, and needle counts were correct x2. The ureter was present Ray-Tec of the case. The patient was very stable. Foley catheter was still in the bladder bladder draining clear urine. There was 75 cc of blood loss and 150 cc of urine output this point.  COUNTS:  YES  PLAN OF CARE: Transfer to PACU

## 2011-12-28 NOTE — Addendum Note (Signed)
Addendum  created 12/28/11 1414 by Graciela Husbands, CRNA   Modules edited:Notes Section

## 2011-12-28 NOTE — Anesthesia Postprocedure Evaluation (Signed)
  Anesthesia Post-op Note  Patient: Tracey Schneider  Procedure(s) Performed: Procedure(s) (LRB): LAPAROSCOPIC ASSISTED VAGINAL HYSTERECTOMY (N/A) SALPINGO OOPHERECTOMY (Bilateral) CYSTOSCOPY (N/A) ANTERIOR REPAIR (CYSTOCELE) (N/A)  Patient Location: 315  Anesthesia Type: General  Level of Consciousness: awake, alert  and oriented  Airway and Oxygen Therapy: Patient Spontanous Breathing and Patient connected to nasal cannula oxygen  Post-op Pain: mild  Post-op Assessment: Post-op Vital signs reviewed and Patient's Cardiovascular Status Stable  Post-op Vital Signs: Reviewed and stable  Complications: No apparent anesthesia complications

## 2011-12-28 NOTE — Progress Notes (Signed)
Day of Surgery Procedure(s) (LRB): LAPAROSCOPIC ASSISTED VAGINAL HYSTERECTOMY (N/A) SALPINGO OOPHERECTOMY (Bilateral) CYSTOSCOPY (N/A) ANTERIOR REPAIR (CYSTOCELE) (N/A)  Subjective: Patient reports nausea.  She has had emesis x iii.  Pain control is good.  She has been able to ambulate in the room.  Objective: I have reviewed patient's vital signs, intake and output and medications.  General: alert and cooperative Resp: clear to auscultation bilaterally Cardio: regular rate and rhythm, S1, S2 normal, no murmur, click, rub or gallop GI: soft, mildly distended, quiet Extremities: extremities normal, atraumatic, no cyanosis or edema Vaginal Bleeding: none Inc:  clean/dry/intact  Assessment: s/p Procedure(s) (LRB): LAPAROSCOPIC ASSISTED VAGINAL HYSTERECTOMY (N/A) SALPINGO OOPHERECTOMY (Bilateral) CYSTOSCOPY (N/A) ANTERIOR REPAIR (CYSTOCELE) (N/A): stable  Plan: Encourage ambulation add phenergan for nausea  LOS: 0 days    Tracey Schneider SUZANNE 12/28/2011, 7:21 PM

## 2011-12-28 NOTE — H&P (Signed)
Tracey Schneider is an 61 y.o. female G2P2 with pelvic relaxation here for LAVH/BSO and pelvic prolapse repair with Dr. Sherron Monday.  She has been seen for a pre-op visit with risks/benefits discussed in my office.  She is here and ready to proceed.  Pertinent Gynecological History: Menses: post-menopausal Bleeding: post menopausal Contraception: post menopausal status DES exposure: denies Blood transfusions: none Sexually transmitted diseases: no past history Previous GYN Procedures: none  Last mammogram: normal Date: 3/13  Last pap: normal Date: 2/13 OB History: G2, P2   Menstrual History: Menarche age: around age 26 No LMP recorded. Patient is postmenopausal.    Past Medical History  Diagnosis Date  . SVD (spontaneous vaginal delivery)     x 2  . 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 Arthritis     hands - no meds  . History of osteopenia   . Ringing of ears     tx with HYGROTON    Past Surgical History  Procedure Date  . Hernia repair 1983    right side  . Tonsillectomy   . Wisdom tooth extraction     No family history on file.  Social History:  reports that she has never smoked. She has never used smokeless tobacco. She reports that she does not drink alcohol or use illicit drugs.  Allergies:  Allergies  Allergen Reactions  . Codeine Shortness Of Breath and Anxiety  . Sulfonamide Derivatives Rash    Prescriptions prior to admission  Medication Sig Dispense Refill  . alendronate (FOSAMAX) 70 MG tablet Take 70 mg by mouth every 7 (seven) days. Take with a full glass of water on an empty stomach.      Marland Kitchen amitriptyline (ELAVIL) 25 MG tablet Take 12.5 mg by mouth at bedtime.      . chlorthalidone (HYGROTON) 25 MG tablet Take 25 mg by mouth 2 (two) times a week.      . potassium chloride (K-DUR,KLOR-CON) 10 MEQ tablet Take 10 mEq by mouth 2 (two) times a week.        Review of Systems  Constitutional:  Negative for fever and chills.  Eyes: Negative for blurred vision and double vision.  Respiratory: Negative for cough.   Cardiovascular: Negative for chest pain and palpitations.  Gastrointestinal: Negative for heartburn, nausea, vomiting and abdominal pain.  Genitourinary: Negative for dysuria and urgency.  Musculoskeletal: Negative for myalgias.  Skin: Negative for rash.  Neurological: Negative for headaches.  Endo/Heme/Allergies: Does not bruise/bleed easily.    Blood pressure 137/83, pulse 89, temperature 98.1 F (36.7 C), temperature source Oral, resp. rate 18, height 5\' 10"  (1.778 m), weight 82.555 kg (182 lb), SpO2 98.00%. Physical Exam  Vitals reviewed. Constitutional: She is oriented to person, place, and time. She appears well-developed and well-nourished.  HENT:  Head: Normocephalic and atraumatic.  Neck: Normal range of motion.  Cardiovascular: Normal rate and regular rhythm.   Respiratory: Effort normal and breath sounds normal.  GI: Soft. Bowel sounds are normal.  Musculoskeletal: Normal range of motion.  Neurological: She is alert and oriented to person, place, and time.  Skin: Skin is warm and dry.  Psychiatric: She has a normal mood and affect.    No results found for this or any previous visit (from the past 24 hour(s)).  No results found.  Assessment/Plan: 61 year old G2P2 MWF with pelvic relaxation here for definitive treatment.  Combined surgery with Dr. Sherron Monday planned.  Spouse here.  Procedure reviewed.  All questions answered.  [name].  Tracey Schneider 12/28/2011, 7:15 AM

## 2011-12-28 NOTE — Transfer of Care (Signed)
Immediate Anesthesia Transfer of Care Note  Patient: Tracey Schneider  Procedure(s) Performed: Procedure(s) (LRB): LAPAROSCOPIC ASSISTED VAGINAL HYSTERECTOMY (N/A) SALPINGO OOPHERECTOMY (Bilateral) CYSTOSCOPY (N/A) ANTERIOR REPAIR (CYSTOCELE) (N/A)  Patient Location: PACU  Anesthesia Type: General  Level of Consciousness: awake, sedated and patient cooperative  Airway & Oxygen Therapy: Patient Spontanous Breathing and Patient connected to nasal cannula oxygen  Post-op Assessment: Report given to PACU RN and Post -op Vital signs reviewed and stable  Post vital signs: Reviewed and stable  Complications: No apparent anesthesia complications

## 2011-12-28 NOTE — Anesthesia Postprocedure Evaluation (Signed)
  Anesthesia Post-op Note  Patient: Tracey Schneider  Procedure(s) Performed: Procedure(s) (LRB): LAPAROSCOPIC ASSISTED VAGINAL HYSTERECTOMY (N/A) SALPINGO OOPHERECTOMY (Bilateral) CYSTOSCOPY (N/A) ANTERIOR REPAIR (CYSTOCELE) (N/A)  Patient is awake and responsive. Pain and nausea are reasonably well controlled. Vital signs are stable and clinically acceptable. Oxygen saturation is clinically acceptable. There are no apparent anesthetic complications at this time. Patient is ready for discharge.

## 2011-12-28 NOTE — Anesthesia Preprocedure Evaluation (Signed)
Anesthesia Evaluation  Patient identified by MRN, date of birth, ID band Patient awake    Reviewed: Allergy & Precautions, H&P , NPO status , Patient's Chart, lab work & pertinent test results  Airway Mallampati: I TM Distance: >3 FB Neck ROM: full    Dental No notable dental hx. (+) Teeth Intact   Pulmonary neg pulmonary ROS,          Cardiovascular negative cardio ROS      Neuro/Psych negative psych ROS   GI/Hepatic negative GI ROS, Neg liver ROS,   Endo/Other  negative endocrine ROS  Renal/GU negative Renal ROS  negative genitourinary   Musculoskeletal negative musculoskeletal ROS (+)   Abdominal Normal abdominal exam  (+)   Peds negative pediatric ROS (+)  Hematology negative hematology ROS (+)   Anesthesia Other Findings   Reproductive/Obstetrics negative OB ROS                           Anesthesia Physical Anesthesia Plan  ASA: II  Anesthesia Plan: General   Post-op Pain Management:    Induction: Intravenous  Airway Management Planned: Oral ETT  Additional Equipment:   Intra-op Plan:   Post-operative Plan: Extubation in OR  Informed Consent: I have reviewed the patients History and Physical, chart, labs and discussed the procedure including the risks, benefits and alternatives for the proposed anesthesia with the patient or authorized representative who has indicated his/her understanding and acceptance.   Dental Advisory Given  Plan Discussed with: CRNA and Surgeon  Anesthesia Plan Comments:         Anesthesia Quick Evaluation

## 2011-12-29 ENCOUNTER — Encounter (HOSPITAL_COMMUNITY): Payer: Self-pay | Admitting: Obstetrics & Gynecology

## 2011-12-29 LAB — CBC
HCT: 31.8 % — ABNORMAL LOW (ref 36.0–46.0)
MCHC: 34 g/dL (ref 30.0–36.0)
RDW: 14.8 % (ref 11.5–15.5)

## 2011-12-29 NOTE — Progress Notes (Signed)
1 Day Post-Op Procedure(s) (LRB): LAPAROSCOPIC ASSISTED VAGINAL HYSTERECTOMY (N/A) SALPINGO OOPHERECTOMY (Bilateral) CYSTOSCOPY (N/A) ANTERIOR REPAIR (CYSTOCELE) (N/A)  Subjective: Patient reports tolerating PO, + flatus and no problems voiding.  Foley removed this am and patient has been able to void once.  Objective: I have reviewed patient's vital signs, intake and output, medications and labs.  General: alert and cooperative Resp: clear to auscultation bilaterally Cardio: regular rate and rhythm, S1, S2 normal, no murmur, click, rub or gallop GI: soft, non-tender; bowel sounds normal; no masses,  no organomegaly and incision: clean, dry and intact Extremities: extremities normal, atraumatic, no cyanosis or edema Vaginal Bleeding: minimal, vaginal pack is out  Assessment: s/p Procedure(s) (LRB): LAPAROSCOPIC ASSISTED VAGINAL HYSTERECTOMY (N/A) SALPINGO OOPHERECTOMY (Bilateral) CYSTOSCOPY (N/A) ANTERIOR REPAIR (CYSTOCELE) (N/A): stable and progressing well  Plan: Encourage ambulation Discontinue IV fluids Discharge home  LOS: 1 day    Valentina Shaggy SUZANNE 12/29/2011, 7:47 AM

## 2011-12-29 NOTE — Discharge Summary (Signed)
Physician Discharge Summary  Patient ID: Tracey Schneider MRN: [id number] DOB/AGE: [birth year] 61 y.o.  Admit date: 12/28/2011 Discharge date: 12/29/2011  Admission Diagnoses:Pelvic prolapse, Cystocele, Rectocele, history of Bell's Palsy, Osteoporosis  Discharge Diagnoses:  Active Problems:  * No active hospital problems. *    Discharged Condition: good  Hospital Course: Patient admitted through same day surgery.  LAVH/BSO, Anterior repair and cystoscopy performed as a combined procedure involving Dr. Lorin Picket MacDiarmid.  Patient did well during surgery and was without complications.  From there, she was transferred to the PACU and then to the third floor for the remainder of her hospitalization.  She did have a catheter in place due to a vaginal packing that was placed at the end of the surgery.  Once on the third floor, she had stable vital signs and was afebrile.  She did have three episodes of emesis and post operative nausea.  This resolved overnight.  By the am of POD#1, she felt much better.  Nausea was resolved and she had passed flatus.  Foley catheter was removed.  Vaginal packing was removed.  Vaginal packing had some dark blood on it.  There was no active bleeding after removal of the vaginal packing.  Patient transitioned to regular diet and was able to ambulate easily.  PCA will be removed.  IV will be removed.  She will transition to oral pain medications.  She will hopefully go home later today.  Findings:  Intraop findings include large cystocele and uterine prolapse.  With laparoscopy, she had two areas of omental adhesions in the upper abdomen.  Otherwise, pelvis and abdominal organs appeared normal.  Consults: None  Significant Diagnostic Studies: labs: hemoglobin (post-op) 10.8  Treatments: surgery: LAVH/BSO, Anterior repair, cystoscopy  Discharge Exam: Blood pressure 121/70, pulse 83, temperature 98.2 F (36.8 C), temperature source Oral, resp. rate 18, height 5\' 10"   (1.778 m), weight 82.555 kg (182 lb), SpO2 98.00%. General appearance: alert and cooperative Resp: clear to auscultation bilaterally Cardio: regular rate and rhythm, S1, S2 normal, no murmur, click, rub or gallop GI: soft, non-tender; bowel sounds normal; no masses,  no organomegaly Pelvic: scant bleeding on pad Incision/Wound:clean, dry, intact  Disposition: Final discharge disposition not confirmed   Medication List  As of 12/29/2011  7:50 AM   ASK your doctor about these medications         alendronate 70 MG tablet   Commonly known as: FOSAMAX   Take 70 mg by mouth every 7 (seven) days. Take with a full glass of water on an empty stomach.      amitriptyline 25 MG tablet   Commonly known as: ELAVIL   Take 12.5 mg by mouth at bedtime.      chlorthalidone 25 MG tablet   Commonly known as: HYGROTON   Take 25 mg by mouth 2 (two) times a week.      potassium chloride 10 MEQ tablet   Commonly known as: K-DUR,KLOR-CON   Take 10 mEq by mouth 2 (two) times a week.             SignedAnnamaria Boots 12/29/2011, 7:50 AM

## 2011-12-29 NOTE — Progress Notes (Signed)
Vaginal packing removed as ordered.  Scant amount dark, vaginal drainage noted on packing.  Patient tolerated well, foley cath removed as ordered  Patient stated back felt much better after packing was removed

## 2011-12-29 NOTE — Progress Notes (Signed)
Pt is discharged in the care of husband,per wheelchair. Stable Denies any pain or discomfort. Spirits good. Pt. Was given instructions on bladder care for home use. States she understands all bladder home care . Questions were asked and answered. Knows when to visit Dr. For appointments.

## 2011-12-29 NOTE — Progress Notes (Signed)
1 Day Post-Op Subjective: Patient reports tolerating PO and pain control good.  Packing and foley have been removed.  She has voided 3 times this am: void 300 with PVR 550; void 300 with PVR 472, void 650 with PVR 341.  She denies abd pain.  She has ambulated without difficulty as well.  Objective: Vital signs in last 24 hours: Temp:  [97.3 F (36.3 C)-98.6 F (37 C)] 98.2 F (36.8 C) (07/17 0649) Pulse Rate:  [59-83] 83  (07/17 0649) Resp:  [14-18] 18  (07/17 0649) BP: (95-124)/(56-72) 121/70 mmHg (07/17 0649) SpO2:  [95 %-99 %] 98 % (07/17 0649)  Intake/Output from previous day: 07/16 0701 - 07/17 0700 In: 4978.8 [P.O.:340; I.V.:4626.8; IV Piggyback:12] Out: 4000 [Urine:3100; Emesis/NG output:800; Blood:100] Intake/Output this shift: Total I/O In: 404.2 [I.V.:404.2] Out: 725 [Urine:725]  Physical Exam:  General:alert, cooperative and no distress GI: soft, non tender, normal bowel sounds, no palpable masses Cardiac: RRR Resp: clear to auscultation bilaterally  Lab Results:  Basename 12/29/11 0535  HGB 10.8*  HCT 31.8*   BMET No results found for this basename: NA:2,K:2,CL:2,CO2:2,GLUCOSE:2,BUN:2,CREATININE:2,CALCIUM:2 in the last 72 hours No results found for this basename: LABPT:3,INR:3 in the last 72 hours No results found for this basename: LABURIN:1 in the last 72 hours Results for orders placed during the hospital encounter of 12/21/11  SURGICAL PCR SCREEN     Status: Normal   Collection Time   12/21/11  8:56 AM      Component Value Range Status Comment   MRSA, PCR NEGATIVE  NEGATIVE Final    Staphylococcus aureus NEGATIVE  NEGATIVE Final     Studies/Results: No results found.  Assessment/Plan: 1 Day Post-Op Procedure(s) (LRB): LAPAROSCOPIC ASSISTED VAGINAL HYSTERECTOMY (N/A) SALPINGO OOPHERECTOMY (Bilateral) CYSTOSCOPY (N/A) ANTERIOR REPAIR (CYSTOCELE) (N/A)  Pt is doing well POD 1, however, she has elevated PVRs. Although these are decreasing, they are  still quite high.  Will replace foley and schedule pt for f/u Friday 12/31/11 for foley removal and void trial.  Pain medication was provided by Dr. Hyacinth Meeker.  Pt was instructed she can ambulate and shower.  She should avoid straining for 3 months.   LOS: 1 day   YARBROUGH,Etheleen Valtierra G. 12/29/2011, 9:55 AM

## 2011-12-29 NOTE — Plan of Care (Signed)
Problem: Phase II Progression Outcomes Goal: Voiding trials/Bladder training within 48 hrs Outcome: Not Met (add Reason) Pt  Started voiding trials    Unable  To   Void adequately   Home with cath

## 2012-07-25 ENCOUNTER — Other Ambulatory Visit: Payer: Self-pay | Admitting: *Deleted

## 2012-07-25 DIAGNOSIS — Z1231 Encounter for screening mammogram for malignant neoplasm of breast: Secondary | ICD-10-CM

## 2012-08-24 ENCOUNTER — Ambulatory Visit
Admission: RE | Admit: 2012-08-24 | Discharge: 2012-08-24 | Disposition: A | Discharge status: Home / Self Care | Payer: BC Managed Care – PPO | Source: Ambulatory Visit | Attending: *Deleted | Admitting: *Deleted

## 2012-08-24 DIAGNOSIS — Z1231 Encounter for screening mammogram for malignant neoplasm of breast: Secondary | ICD-10-CM

## 2013-01-29 ENCOUNTER — Encounter: Payer: Self-pay | Admitting: *Deleted

## 2013-02-01 ENCOUNTER — Encounter: Payer: Self-pay | Admitting: Nurse Practitioner

## 2013-02-01 ENCOUNTER — Ambulatory Visit (INDEPENDENT_AMBULATORY_CARE_PROVIDER_SITE_OTHER): Payer: BC Managed Care – PPO | Admitting: Nurse Practitioner

## 2013-02-01 VITALS — BP 128/60 | HR 74 | Resp 14 | Ht 70.0 in | Wt 184.6 lb

## 2013-02-01 DIAGNOSIS — Z01419 Encounter for gynecological examination (general) (routine) without abnormal findings: Secondary | ICD-10-CM

## 2013-02-01 DIAGNOSIS — Z0000 Encounter for general adult medical examination without abnormal findings: Secondary | ICD-10-CM

## 2013-02-01 MED ORDER — ESTRADIOL 0.1 MG/GM VA CREA: 1.0000 g | TOPICAL_CREAM | VAGINAL | Status: DC

## 2013-02-01 NOTE — Patient Instructions (Addendum)

## 2013-02-01 NOTE — Progress Notes (Signed)
62 y.o. G2P2 Married Caucasian Fe here for annual exam. Patient had LAVH/ BSO 7/13 and has done well. No stress incontinence. No pain with sexual activity.  She has a new grandson now 42 days old.   No LMP recorded. Patient has had a hysterectomy.          Sexually active: yes  The current method of family planning is status post hysterectomy.    Exercising: yes  cardio and weights  Smoker:  no  Health Maintenance: Pap:  08/06/2011  Normal with Negative HR HPV  MMG:  08/2012 normal Colonoscopy:  07/2006 recheck in 10 years BMD:   08/2012  (back on Fosamax for 3 years) TDaP:  2007 Labs: PCP maintains all labs.    reports that she has never smoked. She has never used smokeless tobacco. She reports that she does not drink alcohol or use illicit drugs.  Past Medical History  Diagnosis Date  . SVD (spontaneous vaginal delivery)     x 2  . 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 Arthritis     hands - no meds  . History of osteopenia   . Ringing of ears     tx with HYGROTON  . Meniere disease   . Bell's palsy   . Brachial plexitis   . Osteoporosis     Past Surgical History  Procedure Laterality Date  . Hernia repair  1983    right side  . Tonsillectomy    . Wisdom tooth extraction    . Laparoscopic assisted vaginal hysterectomy  12/28/2011    Procedure: LAPAROSCOPIC ASSISTED VAGINAL HYSTERECTOMY;  Surgeon: Annamaria Boots, MD;  Location: [facility];  Service: Gynecology;  Laterality: N/A;  MacDiarmid ro follow with possible A&P repair. Pam to call  . Salpingoophorectomy  12/28/2011    Procedure: SALPINGO OOPHERECTOMY;  Surgeon: Annamaria Boots, MD;  Location: [facility];  Service: Gynecology;  Laterality: Bilateral;  . Cystoscopy  12/28/2011    Procedure: CYSTOSCOPY;  Surgeon: Martina Sinner, MD;  Location: [facility];  Service: Urology;  Laterality: N/A;  . Cystocele repair  12/28/2011    Procedure: ANTERIOR REPAIR (CYSTOCELE);   Surgeon: Martina Sinner, MD;  Location: [facility];  Service: Urology;  Laterality: N/A;  . Vaginal delivery      x2  . Wrist fracture surgery Left 2003  . Orif tibia fracture Left     non traumatic   . Breast cyst aspiration      numerus times     Current Outpatient Prescriptions  Medication Sig Dispense Refill  . alendronate (FOSAMAX) 70 MG tablet Take 70 mg by mouth every 7 (seven) days. Take with a full glass of water on an empty stomach.      Marland Kitchen amitriptyline (ELAVIL) 25 MG tablet Take 12.5 mg by mouth at bedtime.      . chlorpheniramine (CHLOR-TRIMETON) 4 MG tablet Take 4 mg by mouth 2 (two) times daily as needed for allergies.      . chlorthalidone (HYGROTON) 25 MG tablet Take 25 mg by mouth 2 (two) times a week.      . Cholecalciferol (VITAMIN D-3) 1000 UNITS CAPS Take by mouth.      . polyethylene glycol (MIRALAX / GLYCOLAX) packet Take 17 g by mouth daily.      . potassium chloride (K-DUR,KLOR-CON) 10 MEQ tablet Take 10 mEq by mouth 2 (two) times a week.      Marland Kitchen  thiamine (VITAMIN B-1) 100 MG tablet Take 100 mg by mouth daily.       No current facility-administered medications for this visit.    Family History  Problem Relation Age of Onset  . Vasculitis Mother   . Thyroid disease Mother     hyperthyroid   . Melanoma Father     ROS:  Pertinent items are noted in HPI.  Otherwise, a comprehensive ROS was negative.  Exam:   BP 128/60  Pulse 74  Resp 14  Ht 5\' 10"  (1.778 m)  Wt 184 lb 9.6 oz (83.734 kg)  BMI 26.49 kg/m2 Height: 5\' 10"  (177.8 cm)  Ht Readings from Last 3 Encounters:  02/01/13 5\' 10"  (1.778 m)  12/27/11 5\' 10"  (1.778 m)  12/27/11 5\' 10"  (1.778 m)    General appearance: alert, cooperative and appears stated age Head: Normocephalic, without obvious abnormality, atraumatic Neck: no adenopathy, supple, symmetrical, trachea midline and thyroid normal to inspection and palpation Lungs: clear to auscultation bilaterally Breasts: normal appearance, no  masses or tenderness Heart: regular rate and rhythm Abdomen: soft, non-tender; no masses,  no organomegaly Extremities: extremities normal, atraumatic, no cyanosis or edema Skin: Skin color, texture, turgor normal. No rashes or lesions Lymph nodes: Cervical, supraclavicular, and axillary nodes normal. No abnormal inguinal nodes palpated Neurologic: Grossly normal   Pelvic: External genitalia:  no lesions              Urethra:  normal appearing urethra with no masses, tenderness or lesions              Bartholin's and Skene's: normal                 Vagina: normal appearing vagina with normal color and discharge, no lesions              Cervix: absent              Pap taken: no Bimanual Exam:  Uterus:  uterus absent              Adnexa: no mass, fullness, tenderness               Rectovaginal: Confirms               Anus:  normal sphincter tone, no lesions  A:  Well Woman with normal exam  S/P LAVH / BSO 7/13 with repair of prolapse by Dr. Sherron Monday  P:   Pap smear as per guidelines   Mammogram due 3/15  Refill estrace vaginal cream for a year (print)  Counseled on diet, exercise, ERT risks, return annually or prn  An After Visit Summary was printed and given to the patient.

## 2013-02-02 ENCOUNTER — Telehealth: Payer: Self-pay | Admitting: Nurse Practitioner

## 2013-02-02 NOTE — Telephone Encounter (Signed)
Patient is notified and aware.

## 2013-02-02 NOTE — Telephone Encounter (Signed)
Rx sent in mail to patient per Ms.Patty

## 2013-02-02 NOTE — Progress Notes (Signed)
Encounter reviewed by Dr. Ossiel Marchio Silva.  

## 2013-02-02 NOTE — Telephone Encounter (Signed)
Patient was in yesterday and Tracey Schneider tried to send Rx by E-script and it wouldn't go thru . Patient didn't get her hard copy of the Rx. Before she left the office . She said she could pick it up Monday .

## 2013-10-31 ENCOUNTER — Other Ambulatory Visit: Payer: Self-pay

## 2013-10-31 DIAGNOSIS — Z1231 Encounter for screening mammogram for malignant neoplasm of breast: Secondary | ICD-10-CM

## 2013-11-21 ENCOUNTER — Ambulatory Visit
Admission: RE | Admit: 2013-11-21 | Discharge: 2013-11-21 | Disposition: A | Discharge status: Home / Self Care | Payer: BC Managed Care – PPO | Source: Ambulatory Visit

## 2013-11-21 DIAGNOSIS — Z1231 Encounter for screening mammogram for malignant neoplasm of breast: Secondary | ICD-10-CM

## 2014-02-04 ENCOUNTER — Encounter: Payer: Self-pay | Admitting: Nurse Practitioner

## 2014-02-04 ENCOUNTER — Ambulatory Visit (INDEPENDENT_AMBULATORY_CARE_PROVIDER_SITE_OTHER): Payer: BC Managed Care – PPO | Admitting: Nurse Practitioner

## 2014-02-04 VITALS — BP 120/80 | HR 60 | Ht 69.5 in | Wt 177.0 lb

## 2014-02-04 DIAGNOSIS — Z01419 Encounter for gynecological examination (general) (routine) without abnormal findings: Secondary | ICD-10-CM

## 2014-02-04 DIAGNOSIS — Z1211 Encounter for screening for malignant neoplasm of colon: Secondary | ICD-10-CM

## 2014-02-04 DIAGNOSIS — Z0000 Encounter for general adult medical examination without abnormal findings: Secondary | ICD-10-CM

## 2014-02-04 LAB — POCT URINALYSIS DIPSTICK
Bilirubin, UA: NEGATIVE
GLUCOSE UA: NEGATIVE
Ketones, UA: NEGATIVE
Leukocytes, UA: NEGATIVE
Nitrite, UA: NEGATIVE
PH UA: 5
Protein, UA: NEGATIVE
RBC UA: NEGATIVE
UROBILINOGEN UA: NEGATIVE

## 2014-02-04 MED ORDER — ESTRADIOL 0.1 MG/GM VA CREA: TOPICAL_CREAM | VAGINAL | Status: DC

## 2014-02-04 NOTE — Progress Notes (Signed)
Patient ID: Tracey Schneider, female   DOB: May 10, 1951, 63 y.o.   MRN: [id number] 63 y.o. G2P2 Married Caucasian Fe here for annual exam.  No further bladder symptoms.  Husband retired last December.   Patient's last menstrual period was 08/29/2005.           Sexually active: yes  The current method of family planning is status post hysterectomy.  Exercising: yes cardio and weights 3 days per week and hiking Smoker: no   Health Maintenance:  Pap: 08/06/2011 Normal with Negative HR HPV  MMG: 11/21/13, Bi-Rads 1: negative  Colonoscopy: 07/2006 recheck in 10 years IFOB given BMD: 09/07/2012 -0.4/-1.0 right (back on Fosamax since 2010)  See paper chart for notes about endo visit and Fosamax. TDaP: 2007  Labs: PCP maintains all labs.   Urine:  normal    reports that she has never smoked. She has never used smokeless tobacco. She reports that she does not drink alcohol or use illicit drugs.  Past Medical History  Diagnosis Date  . SVD (spontaneous vaginal delivery)     x 2  . 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 Arthritis     hands - no meds  . History of osteopenia   . Ringing of ears     tx with HYGROTON  . Meniere disease   . Bell's palsy   . Brachial plexitis 1992  . Osteoporosis   . Foot fracture, right 1995    stress fracture    Past Surgical History  Procedure Laterality Date  . Hernia repair  1983    right side  . Tonsillectomy    . Wisdom tooth extraction  age 31  . Laparoscopic assisted vaginal hysterectomy  12/28/2011    Procedure: LAPAROSCOPIC ASSISTED VAGINAL HYSTERECTOMY;  Surgeon: Lyman Speller, MD;  Location: [facility];  Service: Gynecology;  Laterality: N/A;  MacDiarmid ro follow with possible A&P repair. Pam to call  . Salpingoophorectomy  12/28/2011    Procedure: SALPINGO OOPHERECTOMY;  Surgeon: Lyman Speller, MD;  Location: [facility];  Service: Gynecology;  Laterality: Bilateral;  . Cystoscopy   12/28/2011    Procedure: CYSTOSCOPY;  Surgeon: Reece Packer, MD;  Location: [facility];  Service: Urology;  Laterality: N/A;  . Cystocele repair  12/28/2011    Procedure: ANTERIOR REPAIR (CYSTOCELE);  Surgeon: Reece Packer, MD;  Location: [facility];  Service: Urology;  Laterality: N/A;  . Vaginal delivery      x2  . Wrist fracture surgery Left 2003  . Orif tibia fracture Left 2004    non traumatic   . Breast cyst aspiration      numerus times     Current Outpatient Prescriptions  Medication Sig Dispense Refill  . alendronate (FOSAMAX) 70 MG tablet Take 70 mg by mouth every 7 (seven) days. Take with a full glass of water on an empty stomach.      Marland Kitchen amitriptyline (ELAVIL) 25 MG tablet Take 12.5 mg by mouth at bedtime.      . chlorpheniramine (CHLOR-TRIMETON) 4 MG tablet Take 4 mg by mouth 2 (two) times daily as needed for allergies.      . chlorthalidone (HYGROTON) 25 MG tablet Take 25 mg by mouth 2 (two) times a week.      . Cholecalciferol (VITAMIN D-3) 1000 UNITS CAPS Take by mouth.      . estradiol (ESTRACE) 0.1 MG/GM vaginal cream Use vaginal cream 1/2 -  1 gm 2 times weekly  42.5 g  4  . polyethylene glycol (MIRALAX / GLYCOLAX) packet Take 17 g by mouth daily.      . potassium chloride (K-DUR,KLOR-CON) 10 MEQ tablet Take 10 mEq by mouth 2 (two) times a week.      . thiamine (VITAMIN B-1) 100 MG tablet Take 100 mg by mouth daily.       No current facility-administered medications for this visit.    Family History  Problem Relation Age of Onset  . Vasculitis Mother   . Thyroid disease Mother     hyperthyroid   . Melanoma Father     ROS:  Pertinent items are noted in HPI.  Otherwise, a comprehensive ROS was negative.  Exam:   BP 120/80  Pulse 60  Ht 5' 9.5" (1.765 m)  Wt 177 lb (80.287 kg)  BMI 25.77 kg/m2  LMP 08/29/2005 Height: 5' 9.5" (176.5 cm)  Ht Readings from Last 3 Encounters:  02/04/14 5' 9.5" (1.765 m)  02/01/13 5\' 10"  (1.778 m)  12/27/11 5\' 10"  (1.778 m)     General appearance: alert, cooperative and appears stated age Head: Normocephalic, without obvious abnormality, atraumatic Neck: no adenopathy, supple, symmetrical, trachea midline and thyroid normal to inspection and palpation Lungs: clear to auscultation bilaterally Breasts: normal appearance, no masses or tenderness Heart: regular rate and rhythm Abdomen: soft, non-tender; no masses,  no organomegaly Extremities: extremities normal, atraumatic, no cyanosis or edema Skin: Skin color, texture, turgor normal. No rashes or lesions Lymph nodes: Cervical, supraclavicular, and axillary nodes normal. No abnormal inguinal nodes palpated Neurologic: Grossly normal   Pelvic: External genitalia:  no lesions              Urethra:  normal appearing urethra with no masses, tenderness or lesions              Bartholin's and Skene's: normal                 Vagina: normal appearing vagina with normal color and discharge, no lesions              Cervix: absent              Pap taken: No. Bimanual Exam:  Uterus:  uterus absent              Adnexa: no mass, fullness, tenderness               Rectovaginal: Confirms               Anus:  normal sphincter tone, no lesions  A:  Well Woman with normal exam  S/P LAVH with bladder sling 12/28/11  P:   Reviewed health and wellness pertinent to exam  Pap smear not taken today  Mammogram is due 11/2014  Refill Estrace vaginal cream for a year  Counseled with risk of DVT, CVA, cancer, etc.  IFOB given today  Counseled on breast self exam, mammography screening, adequate intake of calcium and vitamin D, diet and exercise, Kegel's exercises return annually or prn  An After Visit Summary was printed and given to the patient.

## 2014-02-04 NOTE — Patient Instructions (Signed)

## 2014-02-10 NOTE — Progress Notes (Signed)
Encounter reviewed by Dr. Johnel Yielding Silva.  

## 2014-02-13 LAB — FECAL OCCULT BLOOD, IMMUNOCHEMICAL: IFOBT: POSITIVE

## 2014-02-13 NOTE — Addendum Note (Signed)
Addended by: Graylon Good on: 02/13/2014 09:51 AM   Modules accepted: Orders

## 2014-02-14 ENCOUNTER — Telehealth: Payer: Self-pay

## 2014-02-14 NOTE — Telephone Encounter (Signed)
Message copied by Jasmine Awe on Thu Feb 14, 2014 10:30 AM ------      Message from: Graylon Good      Created: Thu Feb 14, 2014  9:39 AM                   ----- Message -----         From: Milford Cage, FNP         Sent: 02/13/2014  10:17 PM           To: Graylon Good, CMA            Results via my chart:  Please call her and help schedule apt.  Copy test results for GI/PCP.            Afra, you stool for blood was positive.  You will need to follow with GI and/ or PCP ASAP.  Colletta Maryland will call you to assist in making that apt. ------

## 2014-02-14 NOTE — Telephone Encounter (Signed)
Spoke with patient. Advised patient of results as seen below. Patient is agreeable. Patient would like to see her PCP Tracey Redmon, PA first and then see GI. Patient is out of town for the next two weeks but will call to schedule with Tracey Kirks Redmon, PA for as soon as she gets back. Patient declines me calling to make appointment. Patient requests results be sent to [facility] for her appointment. Advised would fax them today for her to schedule appointment. Patient agreeable. Will call our office if she needs any assistance or has any questions. IFOB results faxed with cover sheet to [facility] [place], [place] at fax number [phone].  Routing to provider for final review. Patient agreeable to disposition. Will close encounter

## 2014-04-15 ENCOUNTER — Encounter: Payer: Self-pay | Admitting: Nurse Practitioner

## 2015-02-06 ENCOUNTER — Ambulatory Visit: Payer: BC Managed Care – PPO | Admitting: Nurse Practitioner

## 2015-03-19 ENCOUNTER — Ambulatory Visit: Payer: Self-pay | Admitting: Nurse Practitioner

## 2015-03-20 ENCOUNTER — Other Ambulatory Visit: Payer: Self-pay

## 2015-03-20 DIAGNOSIS — Z1231 Encounter for screening mammogram for malignant neoplasm of breast: Secondary | ICD-10-CM

## 2015-03-24 ENCOUNTER — Ambulatory Visit (INDEPENDENT_AMBULATORY_CARE_PROVIDER_SITE_OTHER): Payer: 59 | Admitting: Obstetrics and Gynecology

## 2015-03-24 ENCOUNTER — Encounter: Payer: Self-pay | Admitting: Obstetrics and Gynecology

## 2015-03-24 VITALS — BP 138/82 | HR 80 | Resp 14 | Ht 69.5 in | Wt 170.0 lb

## 2015-03-24 DIAGNOSIS — L293 Anogenital pruritus, unspecified: Secondary | ICD-10-CM

## 2015-03-24 DIAGNOSIS — Z01419 Encounter for gynecological examination (general) (routine) without abnormal findings: Secondary | ICD-10-CM

## 2015-03-24 MED ORDER — ESTRADIOL 0.1 MG/GM VA CREA: TOPICAL_CREAM | VAGINAL | Status: DC

## 2015-03-24 MED ORDER — BETAMETHASONE VALERATE 0.1 % EX OINT: TOPICAL_OINTMENT | CUTANEOUS | Status: AC

## 2015-03-24 NOTE — Patient Instructions (Signed)
Try calcium with magnesium to counteract constipation. Goal of 1,200 mg a day of calcium  EXERCISE AND DIET:  We recommended that you start or continue a regular exercise program for good health. Regular exercise means any activity that makes your heart beat faster and makes you sweat.  We recommend exercising at least 30 minutes per day at least 3 days a week, preferably 4 or 5.  We also recommend a diet low in fat and sugar.  Inactivity, poor dietary choices and obesity can cause diabetes, heart attack, stroke, and kidney damage, among others.    ALCOHOL AND SMOKING:  Women should limit their alcohol intake to no more than 7 drinks/beers/glasses of wine (combined, not each!) per week. Moderation of alcohol intake to this level decreases your risk of breast cancer and liver damage. And of course, no recreational drugs are part of a healthy lifestyle.  And absolutely no smoking or even second hand smoke. Most people know smoking can cause heart and lung diseases, but did you know it also contributes to weakening of your bones? Aging of your skin?  Yellowing of your teeth and nails?  CALCIUM AND VITAMIN D:  Adequate intake of calcium and Vitamin D are recommended.  The recommendations for exact amounts of these supplements seem to change often, but generally speaking 600 mg of calcium (either carbonate or citrate) and 800 units of Vitamin D per day seems prudent. Certain women may benefit from higher intake of Vitamin D.  If you are among these women, your doctor will have told you during your visit.    PAP SMEARS:  Pap smears, to check for cervical cancer or precancers,  have traditionally been done yearly, although recent scientific advances have shown that most women can have pap smears less often.  However, every woman still should have a physical exam from her gynecologist every year. It will include a breast check, inspection of the vulva and vagina to check for abnormal growths or skin changes, a  visual exam of the cervix, and then an exam to evaluate the size and shape of the uterus and ovaries.  And after 62 years of age, a rectal exam is indicated to check for rectal cancers. We will also provide age appropriate advice regarding health maintenance, like when you should have certain vaccines, screening for sexually transmitted diseases, bone density testing, colonoscopy, mammograms, etc.   MAMMOGRAMS:  All women over 2 years old should have a yearly mammogram. Many facilities now offer a "3D" mammogram, which may cost around $50 extra out of pocket. If possible,  we recommend you accept the option to have the 3D mammogram performed.  It both reduces the number of women who will be called back for extra views which then turn out to be normal, and it is better than the routine mammogram at detecting truly abnormal areas.    COLONOSCOPY:  Colonoscopy to screen for colon cancer is recommended for all women at age 62.  We know, you hate the idea of the prep.  We agree, BUT, having colon cancer and not knowing it is worse!!  Colon cancer so often starts as a polyp that can be seen and removed at colonscopy, which can quite literally save your life!  And if your first colonoscopy is normal and you have no family history of colon cancer, most women don't have to have it again for 10 years.  Once every ten years, you can do something that may end up saving your life,  right?  We will be happy to help you get it scheduled when you are ready.  Be sure to check your insurance coverage so you understand how much it will cost.  It may be covered as a preventative service at no cost, but you should check your particular policy.

## 2015-03-24 NOTE — Progress Notes (Signed)
Patient ID: Tracey Schneider, female   DOB: [birth year], 64 y.o.   MRN: [id number] 64 y.o. G2P2 MarriedCaucasianF here for annual exam.  H/O LAVH/BSO, cystocele repair in 2013. No recurrent prolapse. Sexually active, uses estrace cream infrequently (forgets). No dyspareunia with lubricant.  On questioning she c/o a 1 month h/o intermittent genital pruritus, worse in the morning. No abnormal d/c.     Patient's last menstrual period was 08/29/2005.          Sexually active: Yes.    The current method of family planning is status post hysterectomy.    Exercising: Yes.    Cardio, weights and abs 2 x a week Smoker:  no  Health Maintenance: Pap:  08-06-11 WNL NEG HPR History of abnormal Pap:  no MMG:  11/22/13 Colonoscopy:  11-21-13 WNL BMD:   09-07-12, osteopenia, on fosomax, followed by primary TDaP:  2007 Gardasil: N/A   reports that she has never smoked. She has never used smokeless tobacco. She reports that she does not drink alcohol or use illicit drugs. Retired. 2 children, both are local. 3 grandchildren: 64, 5 and 2.   Past Medical History  Diagnosis Date  . SVD (spontaneous vaginal delivery)     x 2  . Neuromuscular disorder ([place]) 1995    loss of arm muscles, ? 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 Arthritis     hands - no meds  . History of osteopenia   . Ringing of ears     tx with HYGROTON  . Meniere disease   . Bell's palsy   . Brachial plexitis 1992  . Osteoporosis   . Foot fracture, right 1995    stress fracture    Past Surgical History  Procedure Laterality Date  . Hernia repair  1983    right side  . Tonsillectomy    . Wisdom tooth extraction  age 38  . Laparoscopic assisted vaginal hysterectomy  12/28/2011    Procedure: LAPAROSCOPIC ASSISTED VAGINAL HYSTERECTOMY;  Surgeon: Lyman Speller, MD;  Location: [facility];  Service: Gynecology;  Laterality: N/A;  MacDiarmid ro follow with possible A&P repair. Pam to call  . Salpingoophorectomy  12/28/2011     Procedure: SALPINGO OOPHERECTOMY;  Surgeon: Lyman Speller, MD;  Location: [facility];  Service: Gynecology;  Laterality: Bilateral;  . Cystoscopy  12/28/2011    Procedure: CYSTOSCOPY;  Surgeon: Reece Packer, MD;  Location: [facility];  Service: Urology;  Laterality: N/A;  . Cystocele repair  12/28/2011    Procedure: ANTERIOR REPAIR (CYSTOCELE);  Surgeon: Reece Packer, MD;  Location: [facility];  Service: Urology;  Laterality: N/A;  . Vaginal delivery      x2  . Wrist fracture surgery Left 2003  . Orif tibia fracture Left 2004    non traumatic   . Breast cyst aspiration      numerus times     Current Outpatient Prescriptions  Medication Sig Dispense Refill  . alendronate (FOSAMAX) 70 MG tablet Take 70 mg by mouth every 7 (seven) days. Take with a full glass of water on an empty stomach.    . chlorpheniramine (CHLOR-TRIMETON) 4 MG tablet Take 4 mg by mouth 2 (two) times daily as needed for allergies.    . chlorthalidone (HYGROTON) 25 MG tablet Take 25 mg by mouth 2 (two) times a week.    . Cholecalciferol (VITAMIN D-3) 1000 UNITS CAPS Take by mouth.    . estradiol (ESTRACE) 0.1 MG/GM vaginal cream Use vaginal cream 1/2 -  1 gm 2 times weekly 42.5 g 4  . polyethylene glycol (MIRALAX / GLYCOLAX) packet Take 17 g by mouth daily.    . potassium chloride (K-DUR,KLOR-CON) 10 MEQ tablet Take 10 mEq by mouth 2 (two) times a week.    . thiamine (VITAMIN B-1) 100 MG tablet Take 100 mg by mouth daily.    Marland Kitchen amitriptyline (ELAVIL) 25 MG tablet Take 12.5 mg by mouth at bedtime.     No current facility-administered medications for this visit.    Family History  Problem Relation Age of Onset  . Vasculitis Mother   . Thyroid disease Mother     hyperthyroid   . Melanoma Father     Review of Systems  Constitutional: Negative.   HENT: Negative.   Eyes: Negative.   Respiratory: Negative.   Cardiovascular: Negative.   Gastrointestinal: Negative.   Endocrine: Negative.   Genitourinary:  Negative.   Musculoskeletal: Negative.   Skin: Negative.   Allergic/Immunologic: Negative.   Neurological: Negative.   Psychiatric/Behavioral: Negative.     Exam:   BP 138/82 mmHg  Pulse 80  Resp 14  Ht 5' 9.5" (1.765 m)  Wt 170 lb (77.111 kg)  BMI 24.75 kg/m2  LMP 08/29/2005  Weight change: @WEIGHTCHANGE @ Height:   Height: 5' 9.5" (176.5 cm)  Ht Readings from Last 3 Encounters:  03/24/15 5' 9.5" (1.765 m)  02/04/14 5' 9.5" (1.765 m)  02/01/13 5\' 10"  (1.778 m)    General appearance: alert, cooperative and appears stated age Head: Normocephalic, without obvious abnormality, atraumatic Neck: no adenopathy, supple, symmetrical, trachea midline and thyroid normal to inspection and palpation Lungs: clear to auscultation bilaterally Breasts: normal appearance, no masses or tenderness Heart: regular rate and rhythm Abdomen: soft, non-tender; bowel sounds normal; no masses,  no organomegaly Extremities: extremities normal, atraumatic, no cyanosis or edema Skin: Skin color, texture, turgor normal. No rashes or lesions Lymph nodes: Cervical, supraclavicular, and axillary nodes normal. No abnormal inguinal nodes palpated Neurologic: Grossly normal   Pelvic: External genitalia: mild erythema on the vulva, more marked on the perineum from the vagina to the perianal area.               Urethra:  normal appearing urethra with no masses, tenderness or lesions              Bartholins and Skenes: normal                 Vagina: normal appearing vagina with normal color and discharge, no lesions. Mild atrophy.              Cervix: absent               Bimanual Exam:  Uterus:  uterus absent              Adnexa: no mass, fullness, tenderness               Rectovaginal: Confirms               Anus:  normal sphincter tone, no lesions  Chaperone was present for exam.  A:  Well Woman with normal exam  Genital pruritus   Vaginal atrophy  P:   Mammogram scheduled  DEXA with  primary  Colonoscopy UTD  Continue vit D, discussed calcium intake, recommend 1,200 mg a day (can get calcium with magnesium to counteract constipation)  Continue BSE  Labs UTD with primary  Vulvar skin care reviewed  Steroid ointment as needed for the next  1-2 weeks, then use Vaseline  Affirm test sent  Continue with estrace vaginal cream

## 2015-03-25 LAB — WET PREP BY MOLECULAR PROBE
Candida species: NEGATIVE
Gardnerella vaginalis: NEGATIVE
TRICHOMONAS VAG: NEGATIVE

## 2015-04-01 ENCOUNTER — Ambulatory Visit
Admission: RE | Admit: 2015-04-01 | Discharge: 2015-04-01 | Disposition: A | Discharge status: Home / Self Care | Payer: 59 | Source: Ambulatory Visit

## 2015-04-01 DIAGNOSIS — Z1231 Encounter for screening mammogram for malignant neoplasm of breast: Secondary | ICD-10-CM

## 2016-03-10 ENCOUNTER — Other Ambulatory Visit: Payer: Self-pay | Admitting: Obstetrics & Gynecology

## 2016-03-10 DIAGNOSIS — Z1231 Encounter for screening mammogram for malignant neoplasm of breast: Secondary | ICD-10-CM

## 2016-03-24 ENCOUNTER — Encounter: Payer: Self-pay | Admitting: Nurse Practitioner

## 2016-03-24 ENCOUNTER — Ambulatory Visit: Payer: BLUE CROSS/BLUE SHIELD | Admitting: Nurse Practitioner

## 2016-03-24 VITALS — BP 108/70 | HR 60 | Ht 69.5 in | Wt 172.0 lb

## 2016-03-24 DIAGNOSIS — Z01419 Encounter for gynecological examination (general) (routine) without abnormal findings: Secondary | ICD-10-CM

## 2016-03-24 DIAGNOSIS — E559 Vitamin D deficiency, unspecified: Secondary | ICD-10-CM

## 2016-03-24 DIAGNOSIS — M85859 Other specified disorders of bone density and structure, unspecified thigh: Secondary | ICD-10-CM

## 2016-03-24 DIAGNOSIS — N952 Postmenopausal atrophic vaginitis: Secondary | ICD-10-CM

## 2016-03-24 MED ORDER — ESTRADIOL 0.1 MG/GM VA CREA: TOPICAL_CREAM | VAGINAL | 1 refills | Status: DC

## 2016-03-24 NOTE — Patient Instructions (Signed)

## 2016-03-24 NOTE — Progress Notes (Signed)
Patient ID: Tracey Schneider, female   DOB: [birth year], 65 y.o.   MRN: [id number]  65 y.o. G59P2002 Married  Caucasian Fe here for annual exam.  Will discuss with PCP about repeat BMD and continued use of Fosamax.  She has been on med's for 5 yrs.  Off for 1 yr and BMD went down so went back on Fosamax now for 5 more yrs.  She just went [place] this year - loved it.  Patient's last menstrual period was 08/29/2005 (exact date).          Sexually active: Yes.    The current method of family planning is status post hysterectomy and post menopausal status.    Exercising: Yes.    cardio Smoker:  no  Health Maintenance: Pap: 08/06/11, Negative with neg HR HPV MMG: 03/22/15, Bi-Rads 1: Negative (scheduled for 04/08/16) Colonoscopy: 11/21/13, Normal, repeat in 10 years BMD: 09/07/12, osteopenia, on Fosamax - managed by PCP TDaP: 03/10/16 Shingles: 2015 Pneumonia: Not indicated due to age Hep C and HIV: 03/23/16 blood drive Labs: PCP takes care of all labs - labs reviewed with pt.and will be scanned   reports that she has never smoked. She has never used smokeless tobacco. She reports that she does not drink alcohol or use drugs.  Past Medical History:  Diagnosis Date  . Arthritis    hands - no meds  . Bell's palsy   . Brachial plexitis 1992  . Foot fracture, right 1995   stress fracture  . History of osteopenia   . Meniere disease   . Neuromuscular disorder ([place]) 1995   loss of arm muscles, ? 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 Osteoporosis   . Ringing of ears    tx with HYGROTON  . SVD (spontaneous vaginal delivery)    x 2    Past Surgical History:  Procedure Laterality Date  . BREAST CYST ASPIRATION     numerus times   . CYSTOCELE REPAIR  12/28/2011   Procedure: ANTERIOR REPAIR (CYSTOCELE);  Surgeon: Reece Packer, MD;  Location: [facility];  Service: Urology;  Laterality: N/A;  . CYSTOSCOPY  12/28/2011   Procedure: CYSTOSCOPY;  Surgeon: Reece Packer, MD;  Location:  [facility];  Service: Urology;  Laterality: N/A;  . [place]   right side  . LAPAROSCOPIC ASSISTED VAGINAL HYSTERECTOMY  12/28/2011   Procedure: LAPAROSCOPIC ASSISTED VAGINAL HYSTERECTOMY;  Surgeon: Lyman Speller, MD;  Location: [facility];  Service: Gynecology;  Laterality: N/A;  MacDiarmid ro follow with possible A&P repair. Pam to call  . ORIF TIBIA FRACTURE Left 2004   non traumatic   . SALPINGOOPHORECTOMY  12/28/2011   Procedure: SALPINGO OOPHERECTOMY;  Surgeon: Lyman Speller, MD;  Location: [facility];  Service: Gynecology;  Laterality: Bilateral;  . TONSILLECTOMY    . VAGINAL DELIVERY     x2  . WISDOM TOOTH EXTRACTION  age 11  . WRIST FRACTURE SURGERY Left 2003    Current Outpatient Prescriptions  Medication Sig Dispense Refill  . alendronate (FOSAMAX) 70 MG tablet Take 70 mg by mouth every 7 (seven) days. Take with a full glass of water on an empty stomach.    Marland Kitchen amitriptyline (ELAVIL) 25 MG tablet Take 12.5 mg by mouth at bedtime.    . betamethasone valerate ointment (VALISONE) 0.1 % Apply a pea sized amount topically BID x 1-2 weeks as needed 15 g 0  . chlorpheniramine (CHLOR-TRIMETON) 4 MG tablet Take 4 mg by mouth 2 (  two) times daily as needed for allergies.    . chlorthalidone (HYGROTON) 25 MG tablet Take 25 mg by mouth 2 (two) times a week.    . Cholecalciferol (VITAMIN D-3) 1000 UNITS CAPS Take by mouth.    . estradiol (ESTRACE) 0.1 MG/GM vaginal cream Use vaginal cream 1 gm 2 times weekly 42.5 g 1  . polyethylene glycol (MIRALAX / GLYCOLAX) packet Take 17 g by mouth daily.    . potassium chloride (K-DUR,KLOR-CON) 10 MEQ tablet Take 10 mEq by mouth 2 (two) times a week.    . thiamine (VITAMIN B-1) 100 MG tablet Take 100 mg by mouth daily.     No current facility-administered medications for this visit.     Family History  Problem Relation Age of Onset  . Vasculitis Mother   . Thyroid disease Mother     hyperthyroid   . Melanoma Father     ROS:  Pertinent  items are noted in HPI.  Otherwise, a comprehensive ROS was negative.  Exam:   BP 108/70 (BP Location: Right Arm, Patient Position: Sitting, Cuff Size: Normal)   Pulse 60   Ht 5' 9.5" (1.765 m)   Wt 172 lb (78 kg)   LMP 08/29/2005 (Exact Date)   BMI 25.04 kg/m  Height: 5' 9.5" (176.5 cm) Ht Readings from Last 3 Encounters:  03/24/16 5' 9.5" (1.765 m)  03/24/15 5' 9.5" (1.765 m)  02/04/14 5' 9.5" (1.765 m)    General appearance: alert, cooperative and appears stated age Head: Normocephalic, without obvious abnormality, atraumatic Neck: no adenopathy, supple, symmetrical, trachea midline and thyroid normal to inspection and palpation Lungs: clear to auscultation bilaterally Breasts: normal appearance, no masses or tenderness Heart: regular rate and rhythm Abdomen: soft, non-tender; no masses,  no organomegaly Extremities: extremities normal, atraumatic, no cyanosis or edema Skin: Skin color, texture, turgor normal. No rashes or lesions Lymph nodes: Cervical, supraclavicular, and axillary nodes normal. No abnormal inguinal nodes palpated Neurologic: Grossly normal   Pelvic: External genitalia:  no lesions              Urethra:  normal appearing urethra with no masses, tenderness or lesions              Bartholin's and Skene's: normal                 Vagina: normal appearing vagina with normal color and discharge, no lesions              Cervix: absent              Pap taken: No. Bimanual Exam:  Uterus:  uterus absent              Adnexa: no mass, fullness, tenderness               Rectovaginal: Confirms               Anus:  normal sphincter tone, no lesions  Chaperone present: no  A:  Well Woman with normal exam  S/P LAVH with bladder sling 12/28/11  Atrophic vaginitis  Osteopenia   History of Vit D def.   P:   Reviewed health and wellness pertinent to exam  Pap smear not done  Mammogram is due 04/08/16  Refill on vaginal estrogen but only uses rarely  Counseled  about risk of DVT, CVA, cancer, etc  Will follow Vit D  Counseled on breast self exam, mammography screening, use and side effects of HRT, adequate intake  of calcium and vitamin D, diet and exercise return annually or prn  An After Visit Summary was printed and given to the patient.

## 2016-03-25 ENCOUNTER — Encounter: Payer: Self-pay | Admitting: Nurse Practitioner

## 2016-03-25 LAB — VITAMIN D 25 HYDROXY (VIT D DEFICIENCY, FRACTURES): VIT D 25 HYDROXY: 35 ng/mL (ref 30–100)

## 2016-03-26 NOTE — Progress Notes (Signed)
Encounter reviewed by Dr. Xavious Sharrar Amundson C. Silva.  

## 2016-04-08 ENCOUNTER — Ambulatory Visit
Admission: RE | Admit: 2016-04-08 | Discharge: 2016-04-08 | Disposition: A | Discharge status: Home / Self Care | Payer: BLUE CROSS/BLUE SHIELD | Source: Ambulatory Visit | Attending: Obstetrics & Gynecology | Admitting: Obstetrics & Gynecology

## 2016-04-08 DIAGNOSIS — Z1231 Encounter for screening mammogram for malignant neoplasm of breast: Secondary | ICD-10-CM

## 2016-04-29 ENCOUNTER — Telehealth: Payer: Self-pay

## 2016-04-29 NOTE — Telephone Encounter (Signed)
OK to switch but should be 1/2 gm

## 2016-04-29 NOTE — Telephone Encounter (Signed)
Spoke with patient. Advised patient we have received notification from [facility] that PA for Estrace cream has been denied. Patient's insurance covers Vagifem and Premarin. Patient states she has not tried Premarin and would like to try this alternative if provider is agreeable. Patient still has some Estrace left and would like note to be placed on new rx that she will call when she is ready to have it filled.   Melvia Heaps CNM, okay to switch patient to Premarin cream? Current rx for Estrace is 1 gram twice weekly. Okay for Premarin 1 gram twice weekly or should this be 1/2 gram twice weekly?

## 2016-04-30 MED ORDER — ESTROGENS, CONJUGATED 0.625 MG/GM VA CREA: TOPICAL_CREAM | VAGINAL | 4 refills | Status: AC

## 2016-04-30 NOTE — Telephone Encounter (Signed)
Rx for Premarin cream place 1/2 gram vaginally twice weekly 4RF sent to pharmacy on file with note to fill when notified by the patient. Patient has been notified that this rx is at the pharmacy whenever she needs refills. Patient is agreeable.  Routing to covering provider for final review. Patient agreeable to disposition. Will close encounter.

## 2017-01-03 ENCOUNTER — Telehealth: Payer: Self-pay | Admitting: Obstetrics and Gynecology

## 2017-01-03 NOTE — Telephone Encounter (Signed)
LMTCB/:NP/ .CX/LETTER SENT/RD

## 2017-03-08 ENCOUNTER — Other Ambulatory Visit: Payer: Self-pay | Admitting: Obstetrics & Gynecology

## 2017-03-08 DIAGNOSIS — Z1231 Encounter for screening mammogram for malignant neoplasm of breast: Secondary | ICD-10-CM

## 2017-03-25 ENCOUNTER — Ambulatory Visit: Payer: BLUE CROSS/BLUE SHIELD | Admitting: Certified Nurse Midwife

## 2017-03-28 ENCOUNTER — Ambulatory Visit: Payer: BLUE CROSS/BLUE SHIELD | Admitting: Nurse Practitioner

## 2017-04-13 ENCOUNTER — Encounter: Payer: Self-pay | Admitting: Certified Nurse Midwife

## 2017-04-13 ENCOUNTER — Ambulatory Visit (INDEPENDENT_AMBULATORY_CARE_PROVIDER_SITE_OTHER): Payer: BLUE CROSS/BLUE SHIELD | Admitting: Certified Nurse Midwife

## 2017-04-13 VITALS — BP 112/68 | HR 70 | Resp 16 | Ht 69.25 in | Wt 172.0 lb

## 2017-04-13 DIAGNOSIS — Z01419 Encounter for gynecological examination (general) (routine) without abnormal findings: Secondary | ICD-10-CM | POA: Diagnosis not present

## 2017-04-13 DIAGNOSIS — N951 Menopausal and female climacteric states: Secondary | ICD-10-CM | POA: Diagnosis not present

## 2017-04-13 DIAGNOSIS — Z8739 Personal history of other diseases of the musculoskeletal system and connective tissue: Secondary | ICD-10-CM

## 2017-04-13 NOTE — Patient Instructions (Signed)

## 2017-04-13 NOTE — Progress Notes (Signed)
66 y.o. G40P2002 Married  Caucasian Fe here for annual exam. Menopausal no HRT.Denies vaginal bleeding.Occasional vaginal dryness. Has Premarin cream to use, but does not like the mess. Prefers just something for moisture. Denies other vaginal issues. Sees PCP for aex and labs/ medication management of Elavil. Exercises daily, no health issues today.  Patient's last menstrual period was 08/29/2005 (exact date).          Sexually active: Yes.    The current method of family planning is status post hysterectomy.    Exercising: Yes.    cardio Smoker:  no  Health Maintenance: Pap:  08/06/11 neg, Hysterectomy for prolapse History of Abnormal Pap: no MMG:  04-08-16 category c density birads 1:neg Self Breast exams: yes Colonoscopy:  11/21/13 normal f/u 33yrs BMD:   2014 osteopenia on fosamax, managed by PCP TDaP:  2017 Shingles: 2015, 2018 Pneumonia: not done Hep C and HIV: had done Labs: brought copy   reports that she has never smoked. She has never used smokeless tobacco. She reports that she does not drink alcohol or use drugs.  Past Medical History:  Diagnosis Date  . Arthritis    hands - no meds  . Bell's palsy   . Brachial plexitis 1992  . Foot fracture, right 1995   stress fracture  . History of osteopenia   . Meniere disease   . Neuromuscular disorder ([place]) 1995   loss of arm muscles, ? 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 Osteoporosis   . Ringing of ears    tx with HYGROTON  . SVD (spontaneous vaginal delivery)    x 2  . Vertigo     Past Surgical History:  Procedure Laterality Date  . BREAST CYST ASPIRATION     numerus times   . CYSTOCELE REPAIR  12/28/2011   Procedure: ANTERIOR REPAIR (CYSTOCELE);  Surgeon: Reece Packer, MD;  Location: [facility];  Service: Urology;  Laterality: N/A;  . CYSTOSCOPY  12/28/2011   Procedure: CYSTOSCOPY;  Surgeon: Reece Packer, MD;  Location: [facility];  Service: Urology;  Laterality: N/A;  . [place]   right side   . LAPAROSCOPIC ASSISTED VAGINAL HYSTERECTOMY  12/28/2011   Procedure: LAPAROSCOPIC ASSISTED VAGINAL HYSTERECTOMY;  Surgeon: Lyman Speller, MD;  Location: [facility];  Service: Gynecology;  Laterality: N/A;  MacDiarmid ro follow with possible A&P repair. Pam to call  . ORIF TIBIA FRACTURE Left 2004   non traumatic   . SALPINGOOPHORECTOMY  12/28/2011   Procedure: SALPINGO OOPHERECTOMY;  Surgeon: Lyman Speller, MD;  Location: [facility];  Service: Gynecology;  Laterality: Bilateral;  . TONSILLECTOMY    . VAGINAL DELIVERY     x2  . WISDOM TOOTH EXTRACTION  age 38  . WRIST FRACTURE SURGERY Left 2003    Current Outpatient Prescriptions  Medication Sig Dispense Refill  . alendronate (FOSAMAX) 70 MG tablet Take 70 mg by mouth every 7 (seven) days. Take with a full glass of water on an empty stomach.    Marland Kitchen amitriptyline (ELAVIL) 25 MG tablet Take 12.5 mg by mouth at bedtime.    Marland Kitchen amoxicillin (AMOXIL) 875 MG tablet   0  . betamethasone valerate ointment (VALISONE) 0.1 % Apply a pea sized amount topically BID x 1-2 weeks as needed 15 g 0  . chlorpheniramine (CHLOR-TRIMETON) 4 MG tablet Take 4 mg by mouth 2 (two) times daily as needed for allergies.    . chlorthalidone (HYGROTON) 25 MG tablet Take 25 mg by mouth  2 (two) times a week.    . Cholecalciferol (VITAMIN D-3) 1000 UNITS CAPS Take by mouth.    . conjugated estrogens (PREMARIN) vaginal cream Place 1/2 gram vaginally twice weekly. 30 g 4  . polyethylene glycol (MIRALAX / GLYCOLAX) packet Take 17 g by mouth daily.    . potassium chloride (K-DUR,KLOR-CON) 10 MEQ tablet Take 10 mEq by mouth 2 (two) times a week.    . thiamine (VITAMIN B-1) 100 MG tablet Take 100 mg by mouth daily.     No current facility-administered medications for this visit.     Family History  Problem Relation Age of Onset  . Vasculitis Mother   . Thyroid disease Mother        hyperthyroid   . Melanoma Father     ROS:  Pertinent items are noted in HPI.  Otherwise,  a comprehensive ROS was negative.  Exam:   BP 112/68   Pulse 70   Resp 16   Ht 5' 9.25" (1.759 m)   Wt 172 lb (78 kg)   LMP 08/29/2005 (Exact Date)   BMI 25.22 kg/m  Height: 5' 9.25" (175.9 cm) Ht Readings from Last 3 Encounters:  04/13/17 5' 9.25" (1.759 m)  03/24/16 5' 9.5" (1.765 m)  03/24/15 5' 9.5" (1.765 m)    General appearance: alert, cooperative and appears stated age Head: Normocephalic, without obvious abnormality, atraumatic Neck: no adenopathy, supple, symmetrical, trachea midline and thyroid normal to inspection and palpation Lungs: clear to auscultation bilaterally Breasts: normal appearance, no masses or tenderness, No nipple retraction or dimpling, No nipple discharge or bleeding, No axillary or supraclavicular adenopathy Heart: regular rate and rhythm Abdomen: soft, non-tender; no masses,  no organomegaly Extremities: extremities normal, atraumatic, no cyanosis or edema Skin: Skin color, texture, turgor normal. No rashes or lesions Lymph nodes: Cervical, supraclavicular, and axillary nodes normal. No abnormal inguinal nodes palpated Neurologic: Grossly normal   Pelvic: External genitalia:  no lesions              Urethra:  normal appearing urethra with no masses, tenderness or lesions              Bartholin's and Skene's: normal                 Vagina: normal appearing vagina with normal color and discharge, no lesions              Cervix: absent              Pap taken: No. Bimanual Exam:  Uterus:  uterus absent              Adnexa: no mass, fullness, tenderness and adnexa surgically absent               Rectovaginal: Confirms               Anus:  normal sphincter tone, no lesions  Chaperone present: yes  A:  Well Woman with normal exam  Menopausal no HRT s/p TVH with BSO, bladder suspension  Vaginal dryness does not want refill of Estrogen cream  Osteopenia with PCP management  P:   Reviewed health and wellness pertinent to exam  Discussed OTC  coconut oil use for vaginal dryness. Instructions given . Will advise if no change  Continue with PCP as indicated  Pap smear: no   counseled on breast self exam, mammography screening, feminine hygiene, adequate intake of calcium and vitamin D, diet and exercise  return annually or  prn  An After Visit Summary was printed and given to the patient.

## 2017-04-21 ENCOUNTER — Ambulatory Visit
Admission: RE | Admit: 2017-04-21 | Discharge: 2017-04-21 | Disposition: A | Discharge status: Home / Self Care | Payer: BLUE CROSS/BLUE SHIELD | Source: Ambulatory Visit | Attending: Obstetrics & Gynecology | Admitting: Obstetrics & Gynecology

## 2017-04-21 DIAGNOSIS — Z1231 Encounter for screening mammogram for malignant neoplasm of breast: Secondary | ICD-10-CM

## 2018-03-20 ENCOUNTER — Other Ambulatory Visit: Payer: Self-pay | Admitting: Obstetrics & Gynecology

## 2018-03-20 DIAGNOSIS — Z1231 Encounter for screening mammogram for malignant neoplasm of breast: Secondary | ICD-10-CM

## 2018-04-12 DIAGNOSIS — M859 Disorder of bone density and structure, unspecified: Secondary | ICD-10-CM | POA: Diagnosis not present

## 2018-04-12 DIAGNOSIS — Z0000 Encounter for general adult medical examination without abnormal findings: Secondary | ICD-10-CM | POA: Diagnosis not present

## 2018-04-12 DIAGNOSIS — Z23 Encounter for immunization: Secondary | ICD-10-CM | POA: Diagnosis not present

## 2018-04-12 DIAGNOSIS — H8109 Meniere's disease, unspecified ear: Secondary | ICD-10-CM | POA: Diagnosis not present

## 2018-04-12 DIAGNOSIS — E559 Vitamin D deficiency, unspecified: Secondary | ICD-10-CM | POA: Diagnosis not present

## 2018-04-12 DIAGNOSIS — G540 Brachial plexus disorders: Secondary | ICD-10-CM | POA: Diagnosis not present

## 2018-04-25 ENCOUNTER — Ambulatory Visit
Admission: RE | Admit: 2018-04-25 | Discharge: 2018-04-25 | Disposition: A | Discharge status: Home / Self Care | Payer: Medicare Other | Source: Ambulatory Visit | Attending: Obstetrics & Gynecology | Admitting: Obstetrics & Gynecology

## 2018-04-25 DIAGNOSIS — Z1231 Encounter for screening mammogram for malignant neoplasm of breast: Secondary | ICD-10-CM

## 2018-04-26 ENCOUNTER — Other Ambulatory Visit (HOSPITAL_COMMUNITY)
Admission: RE | Admit: 2018-04-26 | Discharge: 2018-04-26 | Disposition: A | Discharge status: Home / Self Care | Payer: Medicare Other | Source: Ambulatory Visit | Attending: Certified Nurse Midwife | Admitting: Certified Nurse Midwife

## 2018-04-26 ENCOUNTER — Ambulatory Visit (INDEPENDENT_AMBULATORY_CARE_PROVIDER_SITE_OTHER): Payer: Medicare Other | Admitting: Certified Nurse Midwife

## 2018-04-26 ENCOUNTER — Other Ambulatory Visit: Payer: Self-pay

## 2018-04-26 ENCOUNTER — Encounter: Payer: Self-pay | Admitting: Certified Nurse Midwife

## 2018-04-26 VITALS — BP 116/72 | HR 68 | Resp 16 | Ht 69.25 in | Wt 178.0 lb

## 2018-04-26 DIAGNOSIS — N952 Postmenopausal atrophic vaginitis: Secondary | ICD-10-CM

## 2018-04-26 DIAGNOSIS — Z01411 Encounter for gynecological examination (general) (routine) with abnormal findings: Secondary | ICD-10-CM

## 2018-04-26 DIAGNOSIS — Z8739 Personal history of other diseases of the musculoskeletal system and connective tissue: Secondary | ICD-10-CM

## 2018-04-26 DIAGNOSIS — Z124 Encounter for screening for malignant neoplasm of cervix: Secondary | ICD-10-CM

## 2018-04-26 NOTE — Patient Instructions (Signed)

## 2018-04-26 NOTE — Progress Notes (Signed)
67 y.o. G72P2002 Married  Caucasian Fe here for annual exam. Menopausal no vaginal bleeding, but vaginal dryness at times.. Using Premarin cream once weekly at times for dryness vaginally and vaseline for external skin.[facility] PCP Kathyrn Lass yearly for labs and medication management of Elavil, osteoporosis ( on holiday from Fosamax) and Vitamin D. Brought lab copy with her of Vitamin D. No health issues in the past year.  Patient's last menstrual period was 08/29/2005 (exact date).          Sexually active: Yes.    The current method of family planning is status post hysterectomy.    Exercising: Yes.    exercise Smoker:  no  Review of Systems  Constitutional: Negative.   HENT: Negative.   Eyes: Negative.   Respiratory: Negative.   Cardiovascular: Negative.   Gastrointestinal: Negative.   Genitourinary: Negative.   Musculoskeletal: Negative.   Skin: Negative.   Neurological: Negative.   Endo/Heme/Allergies: Negative.   Psychiatric/Behavioral: Negative.     Health Maintenance: Pap:  08-06-11 negative History of Abnormal Pap: no MMG:  04-25-18 density C/BIRADS 1 negative  Self Breast exams: yes Colonoscopy:  11-21-13 normal, f/u 10 years BMD:   04-28-17 osteopenia on Fosamax history of fractures  TDaP:  2017  Shingles: 2018  Pneumonia: 2019  Hep C and HIV: donates blood  Labs: PCP    reports that she has never smoked. She has never used smokeless tobacco. She reports that she does not drink alcohol or use drugs.  Past Medical History:  Diagnosis Date  . Arthritis    hands - no meds  . Bell's palsy   . Brachial plexitis 1992  . Foot fracture, right 1995   stress fracture  . History of osteopenia   . Meniere disease   . 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 Osteoporosis   . Ringing of ears    tx with HYGROTON  . SVD (spontaneous vaginal delivery)    x 2  . Vertigo     Past Surgical History:  Procedure  Laterality Date  . BREAST CYST ASPIRATION     numerus times   . CYSTOCELE REPAIR  12/28/2011   Procedure: ANTERIOR REPAIR (CYSTOCELE);  Surgeon: Reece Packer, MD;  Location: [facility];  Service: Urology;  Laterality: N/A;  . CYSTOSCOPY  12/28/2011   Procedure: CYSTOSCOPY;  Surgeon: Reece Packer, MD;  Location: [facility];  Service: Urology;  Laterality: N/A;  . [place]   right side  . LAPAROSCOPIC ASSISTED VAGINAL HYSTERECTOMY  12/28/2011   Procedure: LAPAROSCOPIC ASSISTED VAGINAL HYSTERECTOMY;  Surgeon: Lyman Speller, MD;  Location: [facility];  Service: Gynecology;  Laterality: N/A;  MacDiarmid ro follow with possible A&P repair. Pam to call  . ORIF TIBIA FRACTURE Left 2004   non traumatic   . SALPINGOOPHORECTOMY  12/28/2011   Procedure: SALPINGO OOPHERECTOMY;  Surgeon: Lyman Speller, MD;  Location: [facility];  Service: Gynecology;  Laterality: Bilateral;  . TONSILLECTOMY    . VAGINAL DELIVERY     x2  . WISDOM TOOTH EXTRACTION  age 53  . WRIST FRACTURE SURGERY Left 2003    Current Outpatient Medications  Medication Sig Dispense Refill  . amitriptyline (ELAVIL) 25 MG tablet Take 12.5 mg by mouth at bedtime.    Marland Kitchen amoxicillin (AMOXIL) 875 MG tablet   0  . betamethasone valerate ointment (VALISONE) 0.1 % Apply a pea sized amount topically  BID x 1-2 weeks as needed 15 g 0  . chlorpheniramine (CHLOR-TRIMETON) 4 MG tablet Take 4 mg by mouth 2 (two) times daily as needed for allergies.    . chlorthalidone (HYGROTON) 25 MG tablet Take 25 mg by mouth 2 (two) times a week.    . Cholecalciferol (VITAMIN D-3) 1000 UNITS CAPS Take 4,000 Units by mouth.     . conjugated estrogens (PREMARIN) vaginal cream Place 1/2 gram vaginally twice weekly. 30 g 4  . polyethylene glycol (MIRALAX / GLYCOLAX) packet Take 17 g by mouth daily.    . potassium chloride (K-DUR,KLOR-CON) 10 MEQ tablet Take 10 mEq by mouth 2 (two) times a week.    . thiamine (VITAMIN B-1) 100 MG tablet Take 100 mg by  mouth daily.     No current facility-administered medications for this visit.     Family History  Problem Relation Age of Onset  . Vasculitis Mother   . Thyroid disease Mother        hyperthyroid   . Melanoma Father     ROS:  Pertinent items are noted in HPI.  Otherwise, a comprehensive ROS was negative.  Exam:   BP 116/72   Pulse 68   Resp 16   Ht 5' 9.25" (1.759 m)   Wt 178 lb (80.7 kg)   LMP 08/29/2005 (Exact Date)   BMI 26.10 kg/m  Height: 5' 9.25" (175.9 cm) Ht Readings from Last 3 Encounters:  04/26/18 5' 9.25" (1.759 m)  04/13/17 5' 9.25" (1.759 m)  03/24/16 5' 9.5" (1.765 m)    General appearance: alert, cooperative and appears stated age Head: Normocephalic, without obvious abnormality, atraumatic Neck: no adenopathy, supple, symmetrical, trachea midline and thyroid normal to inspection and palpation Lungs: clear to auscultation bilaterally Breasts: normal appearance, no masses or tenderness, No nipple retraction or dimpling, No nipple discharge or bleeding, No axillary or supraclavicular adenopathy Heart: regular rate and rhythm Abdomen: soft, non-tender; no masses,  no organomegaly Extremities: extremities normal, atraumatic, no cyanosis or edema Skin: Skin color, texture, turgor normal. No rashes or lesions Lymph nodes: Cervical, supraclavicular, and axillary nodes normal. No abnormal inguinal nodes palpated Neurologic: Grossly normal   Pelvic: External genitalia:  no lesions, atrophic appearance with slight redness, no scaling or exudate              Urethra:  normal appearing urethra with no masses, tenderness or lesions              Bartholin's and Skene's: normal                 Vagina: atrophic appearing vagina with pale color and scant moisture and ? Tissue change in posterior fornix on right, Pap smear taken of area, no lesions              Cervix: absent              Pap taken: Yes.   Bimanual Exam:  Uterus:  uterus absent              Adnexa:  normal adnexa and no mass, fullness, tenderness               Rectovaginal: Confirms               Anus:  normal sphincter tone, no lesions  Chaperone present: yes  A:  Well Woman with normal exam  Post menopausal S/P TVH for prolapse   Atrophic vaginitis with Premarin use once weekly, ?unusual  skin appearance in vagina posterior fornix on right  Osteopenia/osteoporosis on Fosamax( on  Holiday now) with PCP  Management  Vitamin D deficiency with PCP management  P:   Reviewed health and wellness pertinent to exam  Discussed need to advise if vaginal bleeding and area of concern could just be atrophic changes due to decreasing estrogen use. Would recommend twice weekly use if pap normal. Can do Olive oil in the interim and external area also.  Continue follow up with PCP as indicated.  Pap smear: yes   counseled on breast self exam, mammography screening, feminine hygiene, adequate intake of calcium and vitamin D, diet and exercise, Kegel's exercises  return annually or prn  An After Visit Summary was printed and given to the patient.

## 2018-04-28 LAB — CYTOLOGY - PAP
Diagnosis: NEGATIVE
HPV: NOT DETECTED

## 2018-09-28 DIAGNOSIS — Z85828 Personal history of other malignant neoplasm of skin: Secondary | ICD-10-CM | POA: Diagnosis not present

## 2018-09-28 DIAGNOSIS — L812 Freckles: Secondary | ICD-10-CM | POA: Diagnosis not present

## 2018-09-28 DIAGNOSIS — D1801 Hemangioma of skin and subcutaneous tissue: Secondary | ICD-10-CM | POA: Diagnosis not present

## 2018-09-28 DIAGNOSIS — D485 Neoplasm of uncertain behavior of skin: Secondary | ICD-10-CM | POA: Diagnosis not present

## 2018-09-28 DIAGNOSIS — L570 Actinic keratosis: Secondary | ICD-10-CM | POA: Diagnosis not present

## 2018-09-28 DIAGNOSIS — D225 Melanocytic nevi of trunk: Secondary | ICD-10-CM | POA: Diagnosis not present

## 2018-09-28 DIAGNOSIS — L821 Other seborrheic keratosis: Secondary | ICD-10-CM | POA: Diagnosis not present

## 2019-04-03 DIAGNOSIS — Z23 Encounter for immunization: Secondary | ICD-10-CM | POA: Diagnosis not present

## 2019-04-12 ENCOUNTER — Other Ambulatory Visit: Payer: Self-pay | Admitting: Family Medicine

## 2019-04-12 DIAGNOSIS — Z1231 Encounter for screening mammogram for malignant neoplasm of breast: Secondary | ICD-10-CM

## 2019-04-27 DIAGNOSIS — M8580 Other specified disorders of bone density and structure, unspecified site: Secondary | ICD-10-CM | POA: Diagnosis not present

## 2019-04-27 DIAGNOSIS — Z0000 Encounter for general adult medical examination without abnormal findings: Secondary | ICD-10-CM | POA: Diagnosis not present

## 2019-04-27 DIAGNOSIS — G540 Brachial plexus disorders: Secondary | ICD-10-CM | POA: Diagnosis not present

## 2019-04-27 DIAGNOSIS — E559 Vitamin D deficiency, unspecified: Secondary | ICD-10-CM | POA: Diagnosis not present

## 2019-04-27 DIAGNOSIS — H8109 Meniere's disease, unspecified ear: Secondary | ICD-10-CM | POA: Diagnosis not present

## 2019-05-31 ENCOUNTER — Other Ambulatory Visit: Payer: Self-pay

## 2019-05-31 ENCOUNTER — Ambulatory Visit
Admission: RE | Admit: 2019-05-31 | Discharge: 2019-05-31 | Disposition: A | Discharge status: Home / Self Care | Payer: Medicare Other | Source: Ambulatory Visit | Attending: Family Medicine | Admitting: Family Medicine

## 2019-05-31 DIAGNOSIS — Z1231 Encounter for screening mammogram for malignant neoplasm of breast: Secondary | ICD-10-CM

## 2019-09-04 ENCOUNTER — Encounter: Payer: Self-pay | Admitting: Certified Nurse Midwife

## 2019-09-26 DIAGNOSIS — H2513 Age-related nuclear cataract, bilateral: Secondary | ICD-10-CM | POA: Diagnosis not present

## 2019-10-03 DIAGNOSIS — L309 Dermatitis, unspecified: Secondary | ICD-10-CM | POA: Diagnosis not present

## 2019-10-03 DIAGNOSIS — L570 Actinic keratosis: Secondary | ICD-10-CM | POA: Diagnosis not present

## 2019-10-03 DIAGNOSIS — Z85828 Personal history of other malignant neoplasm of skin: Secondary | ICD-10-CM | POA: Diagnosis not present

## 2019-10-03 DIAGNOSIS — L821 Other seborrheic keratosis: Secondary | ICD-10-CM | POA: Diagnosis not present

## 2019-10-03 DIAGNOSIS — D485 Neoplasm of uncertain behavior of skin: Secondary | ICD-10-CM | POA: Diagnosis not present

## 2019-10-03 DIAGNOSIS — D225 Melanocytic nevi of trunk: Secondary | ICD-10-CM | POA: Diagnosis not present

## 2019-10-23 DIAGNOSIS — H8109 Meniere's disease, unspecified ear: Secondary | ICD-10-CM | POA: Diagnosis not present

## 2019-10-23 DIAGNOSIS — M25562 Pain in left knee: Secondary | ICD-10-CM | POA: Diagnosis not present

## 2019-10-23 DIAGNOSIS — M8580 Other specified disorders of bone density and structure, unspecified site: Secondary | ICD-10-CM | POA: Diagnosis not present

## 2019-10-23 DIAGNOSIS — R030 Elevated blood-pressure reading, without diagnosis of hypertension: Secondary | ICD-10-CM | POA: Diagnosis not present

## 2019-10-23 DIAGNOSIS — G540 Brachial plexus disorders: Secondary | ICD-10-CM | POA: Diagnosis not present

## 2019-10-23 DIAGNOSIS — E559 Vitamin D deficiency, unspecified: Secondary | ICD-10-CM | POA: Diagnosis not present

## 2019-10-23 DIAGNOSIS — Z6826 Body mass index (BMI) 26.0-26.9, adult: Secondary | ICD-10-CM | POA: Diagnosis not present

## 2019-10-23 DIAGNOSIS — Z23 Encounter for immunization: Secondary | ICD-10-CM | POA: Diagnosis not present

## 2019-10-24 ENCOUNTER — Other Ambulatory Visit: Payer: Self-pay | Admitting: Family Medicine

## 2019-10-24 DIAGNOSIS — M8588 Other specified disorders of bone density and structure, other site: Secondary | ICD-10-CM

## 2019-11-26 DIAGNOSIS — M25562 Pain in left knee: Secondary | ICD-10-CM | POA: Diagnosis not present

## 2019-12-25 ENCOUNTER — Other Ambulatory Visit: Payer: Self-pay

## 2019-12-25 ENCOUNTER — Ambulatory Visit
Admission: RE | Admit: 2019-12-25 | Discharge: 2019-12-25 | Disposition: A | Discharge status: Home / Self Care | Payer: Medicare Other | Source: Ambulatory Visit | Attending: Family Medicine | Admitting: Family Medicine

## 2019-12-25 DIAGNOSIS — M8588 Other specified disorders of bone density and structure, other site: Secondary | ICD-10-CM

## 2019-12-25 DIAGNOSIS — Z780 Asymptomatic menopausal state: Secondary | ICD-10-CM | POA: Diagnosis not present

## 2019-12-25 DIAGNOSIS — M85851 Other specified disorders of bone density and structure, right thigh: Secondary | ICD-10-CM | POA: Diagnosis not present

## 2020-01-28 DIAGNOSIS — M722 Plantar fascial fibromatosis: Secondary | ICD-10-CM | POA: Diagnosis not present

## 2020-01-28 DIAGNOSIS — M5442 Lumbago with sciatica, left side: Secondary | ICD-10-CM | POA: Diagnosis not present

## 2020-02-02 ENCOUNTER — Other Ambulatory Visit: Payer: Self-pay

## 2020-02-02 ENCOUNTER — Encounter (HOSPITAL_BASED_OUTPATIENT_CLINIC_OR_DEPARTMENT_OTHER): Payer: Self-pay | Admitting: Emergency Medicine

## 2020-02-02 ENCOUNTER — Emergency Department (HOSPITAL_BASED_OUTPATIENT_CLINIC_OR_DEPARTMENT_OTHER): Payer: Medicare Other

## 2020-02-02 DIAGNOSIS — Z5321 Procedure and treatment not carried out due to patient leaving prior to being seen by health care provider: Secondary | ICD-10-CM | POA: Insufficient documentation

## 2020-02-02 DIAGNOSIS — M25532 Pain in left wrist: Secondary | ICD-10-CM | POA: Diagnosis present

## 2020-02-02 DIAGNOSIS — S52592A Other fractures of lower end of left radius, initial encounter for closed fracture: Secondary | ICD-10-CM | POA: Diagnosis not present

## 2020-02-02 MED ORDER — IBUPROFEN 400 MG PO TABS
600.0000 mg | ORAL_TABLET | Freq: Once | ORAL | Status: AC
  Administered 2020-02-02: 600 mg via ORAL
  Filled 2020-02-02: qty 1

## 2020-02-02 NOTE — ED Triage Notes (Signed)
Was tubing today and fell out and hit a rock. C/o L wrist and forearm pain.

## 2020-02-03 ENCOUNTER — Emergency Department (HOSPITAL_BASED_OUTPATIENT_CLINIC_OR_DEPARTMENT_OTHER)
Admission: EM | Admit: 2020-02-03 | Discharge: 2020-02-03 | Disposition: A | Discharge status: Home / Self Care | Payer: Medicare Other | Attending: Emergency Medicine | Admitting: Emergency Medicine

## 2020-02-03 DIAGNOSIS — M25512 Pain in left shoulder: Secondary | ICD-10-CM | POA: Diagnosis not present

## 2020-02-03 NOTE — ED Notes (Signed)
I spoke with patient. She is aware that her arm is Fx. States she also saw her results on my chart. States she is going to follow up at an ortho urgent care this morning.

## 2020-02-04 DIAGNOSIS — M25532 Pain in left wrist: Secondary | ICD-10-CM | POA: Diagnosis not present

## 2020-02-07 DIAGNOSIS — Y999 Unspecified external cause status: Secondary | ICD-10-CM | POA: Diagnosis not present

## 2020-02-07 DIAGNOSIS — S52572A Other intraarticular fracture of lower end of left radius, initial encounter for closed fracture: Secondary | ICD-10-CM | POA: Diagnosis not present

## 2020-02-07 DIAGNOSIS — G8918 Other acute postprocedural pain: Secondary | ICD-10-CM | POA: Diagnosis not present

## 2020-02-07 DIAGNOSIS — S52502A Unspecified fracture of the lower end of left radius, initial encounter for closed fracture: Secondary | ICD-10-CM | POA: Diagnosis not present

## 2020-02-07 DIAGNOSIS — X58XXXA Exposure to other specified factors, initial encounter: Secondary | ICD-10-CM | POA: Diagnosis not present

## 2020-02-19 DIAGNOSIS — S52502D Unspecified fracture of the lower end of left radius, subsequent encounter for closed fracture with routine healing: Secondary | ICD-10-CM | POA: Diagnosis not present

## 2020-02-25 IMAGING — MG DIGITAL SCREENING BILAT W/ TOMO W/ CAD
8 series · 9 of 24 positions shown · non-contrast
Comparison: Previous exam(s).

CLINICAL DATA: Screening.

EXAM:
DIGITAL SCREENING BILATERAL MAMMOGRAM WITH TOMO AND CAD

[L MLO synth-2D]
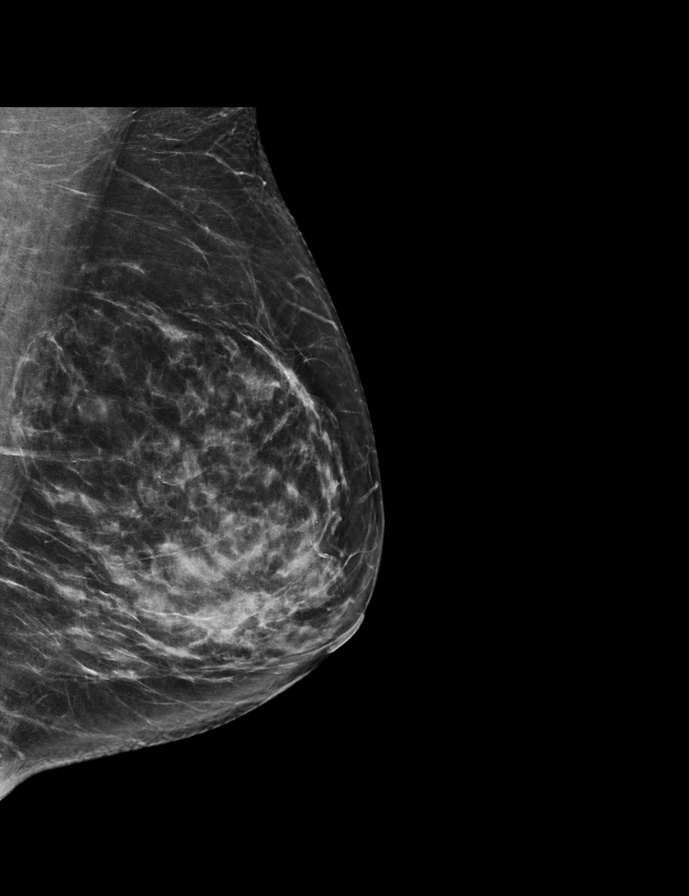

[R CC synth-2D]
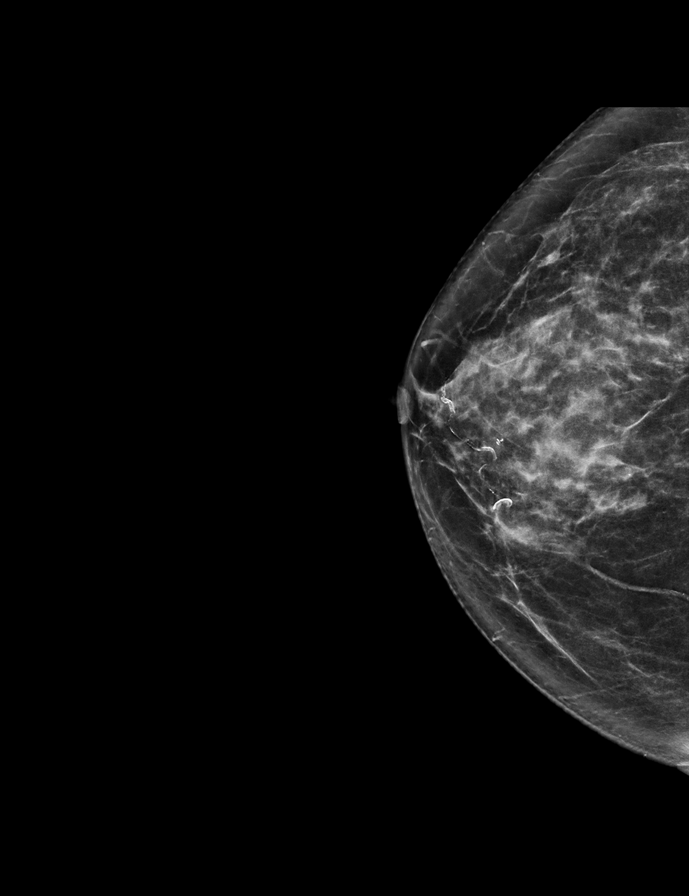

[L CC synth-2D]
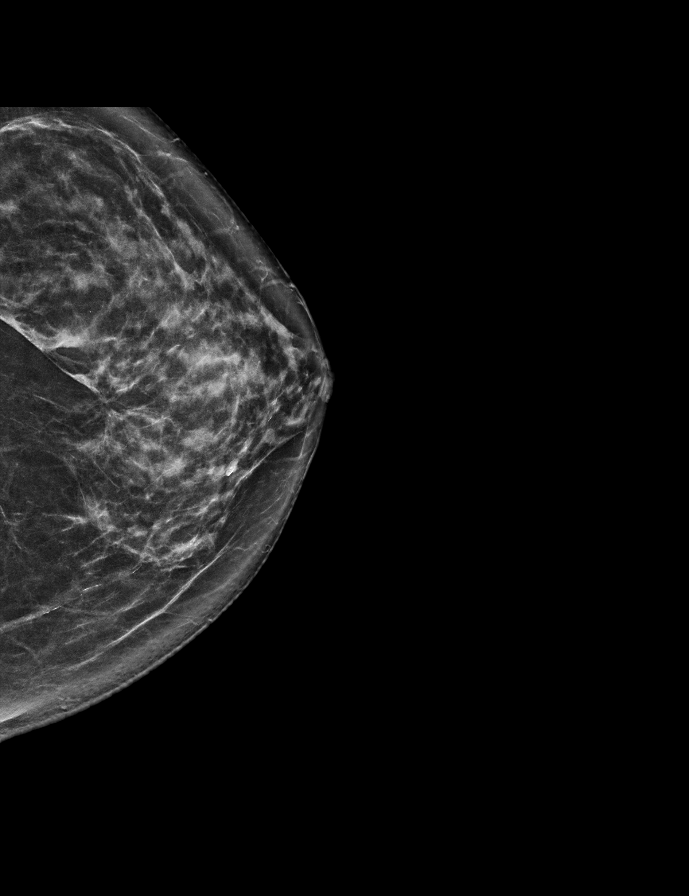

[R MLO synth-2D]
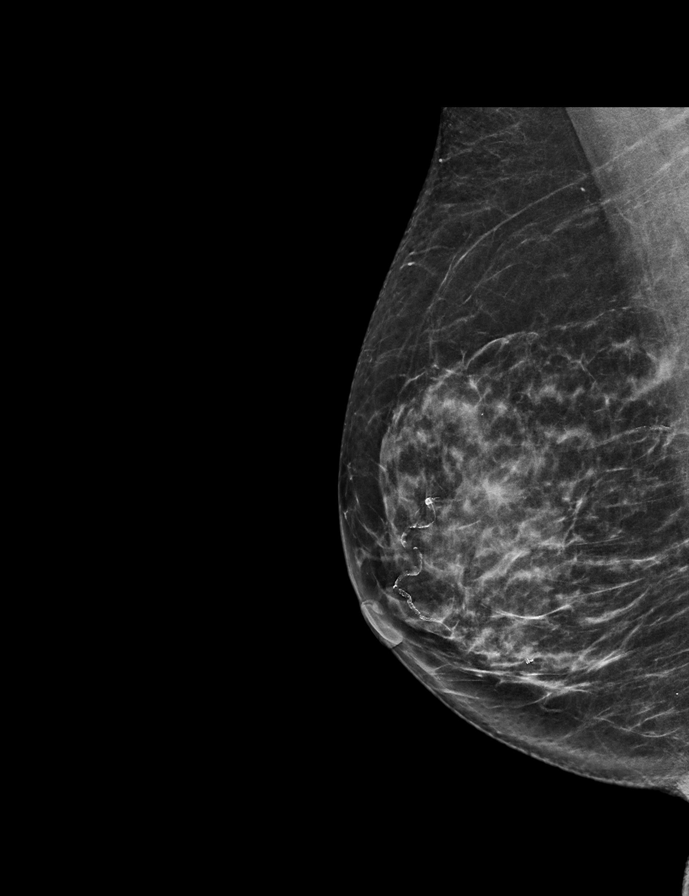

[L CC tomo · 2 of 71 frames shown]
[frame 23/71]
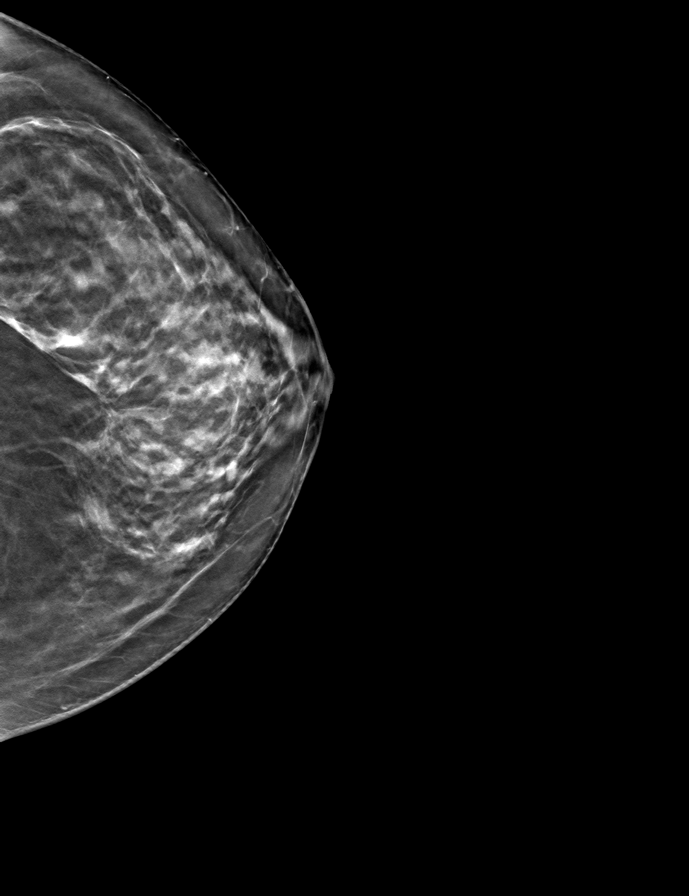
[frame 36/71]
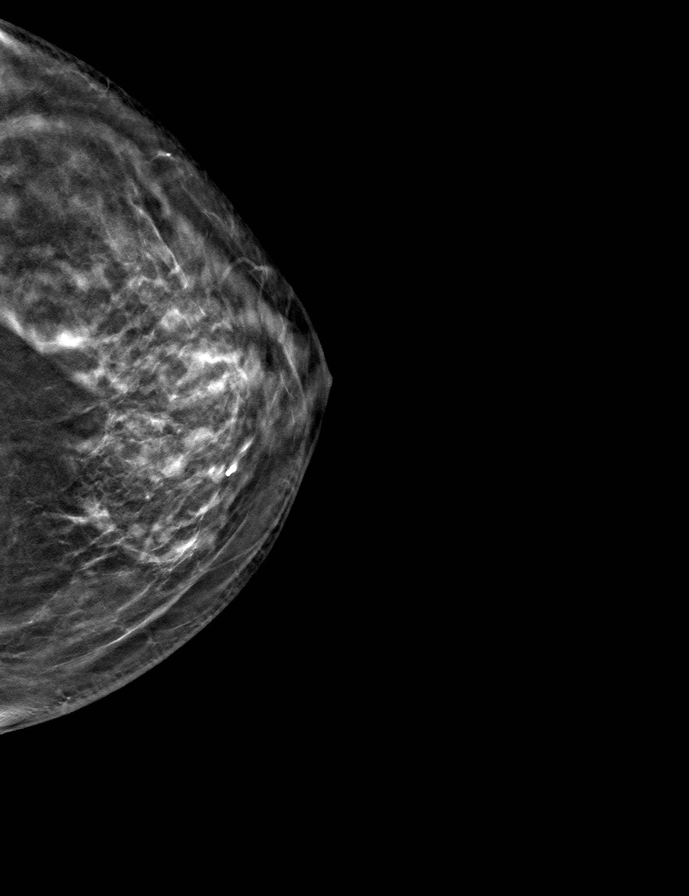

[R MLO tomo · tomo slice 33/65.0]
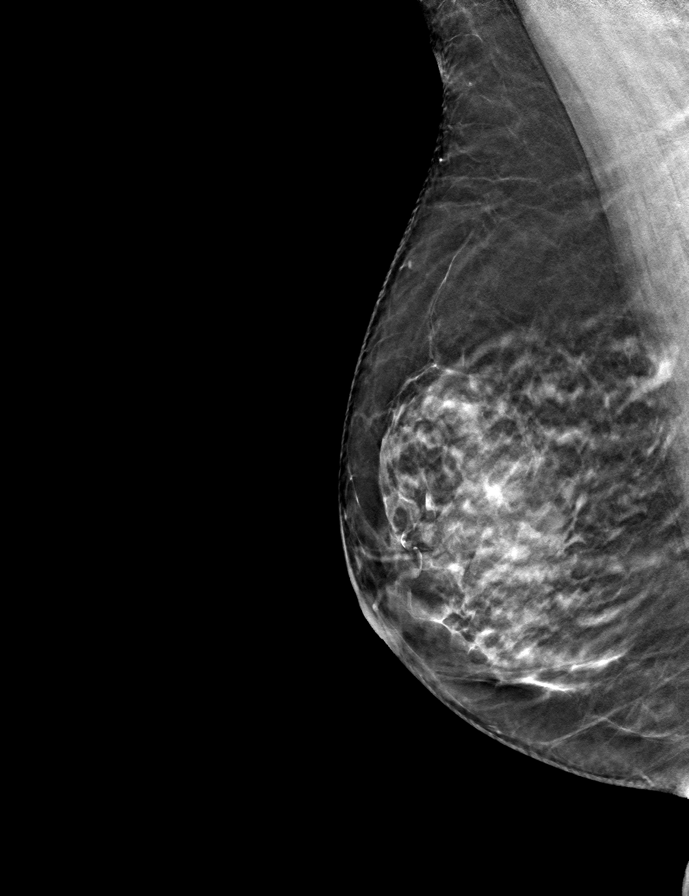

[R CC tomo · tomo slice 33/65.0]
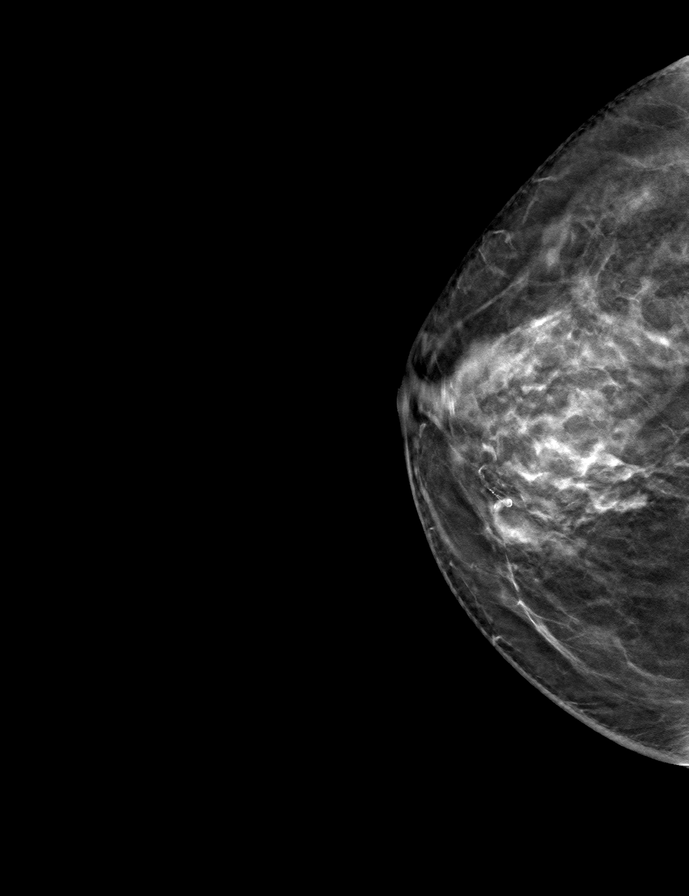

[L MLO tomo · tomo slice 35/68.0]
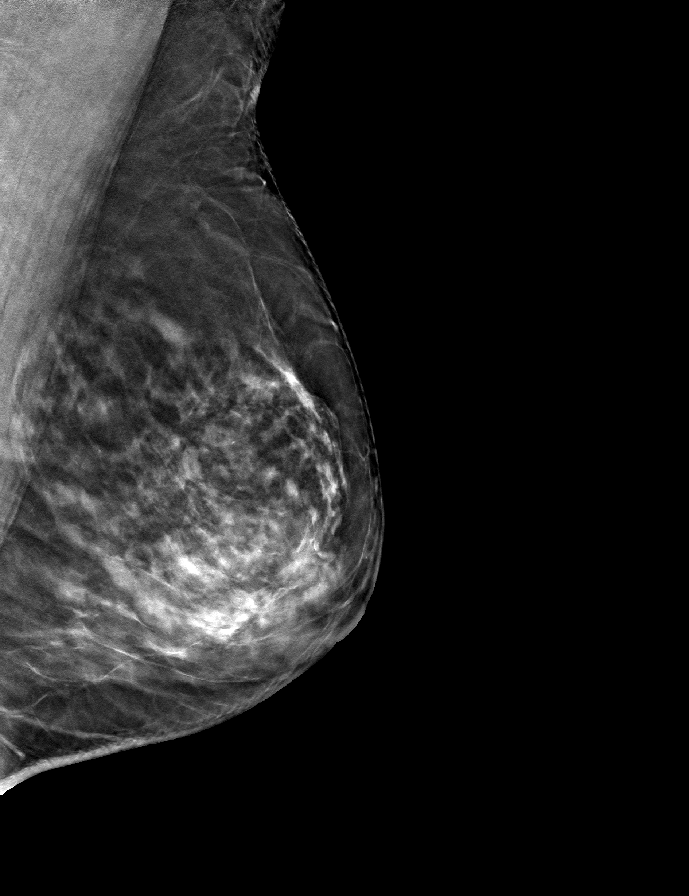

[9 of 24 positions shown; findings below may reference images not displayed]

ACR Breast Density Category c: The breast tissue is heterogeneously
dense, which may obscure small masses.
FINDINGS: There are no findings suspicious for malignancy. Images were
processed with CAD.
IMPRESSION: No mammographic evidence of malignancy. A result letter of this
screening mammogram will be mailed directly to the patient.

RECOMMENDATION:
Screening mammogram in one year. (Code:[id number]-[id number])

BI-RADS CATEGORY  1: Negative.

## 2020-03-07 DIAGNOSIS — S52502D Unspecified fracture of the lower end of left radius, subsequent encounter for closed fracture with routine healing: Secondary | ICD-10-CM | POA: Diagnosis not present

## 2020-04-08 DIAGNOSIS — Z23 Encounter for immunization: Secondary | ICD-10-CM | POA: Diagnosis not present

## 2020-04-18 ENCOUNTER — Other Ambulatory Visit: Payer: Self-pay | Admitting: Family Medicine

## 2020-04-18 DIAGNOSIS — Z1231 Encounter for screening mammogram for malignant neoplasm of breast: Secondary | ICD-10-CM

## 2020-05-12 DIAGNOSIS — G540 Brachial plexus disorders: Secondary | ICD-10-CM | POA: Diagnosis not present

## 2020-05-12 DIAGNOSIS — Z6826 Body mass index (BMI) 26.0-26.9, adult: Secondary | ICD-10-CM | POA: Diagnosis not present

## 2020-05-12 DIAGNOSIS — E559 Vitamin D deficiency, unspecified: Secondary | ICD-10-CM | POA: Diagnosis not present

## 2020-05-12 DIAGNOSIS — M8580 Other specified disorders of bone density and structure, unspecified site: Secondary | ICD-10-CM | POA: Diagnosis not present

## 2020-05-12 DIAGNOSIS — Z0000 Encounter for general adult medical examination without abnormal findings: Secondary | ICD-10-CM | POA: Diagnosis not present

## 2020-05-12 DIAGNOSIS — H8109 Meniere's disease, unspecified ear: Secondary | ICD-10-CM | POA: Diagnosis not present

## 2020-05-12 DIAGNOSIS — S62102D Fracture of unspecified carpal bone, left wrist, subsequent encounter for fracture with routine healing: Secondary | ICD-10-CM | POA: Diagnosis not present

## 2020-06-02 ENCOUNTER — Ambulatory Visit
Admission: RE | Admit: 2020-06-02 | Discharge: 2020-06-02 | Disposition: A | Discharge status: Home / Self Care | Payer: Medicare Other | Source: Ambulatory Visit | Attending: Family Medicine | Admitting: Family Medicine

## 2020-06-02 ENCOUNTER — Other Ambulatory Visit: Payer: Self-pay

## 2020-06-02 DIAGNOSIS — Z1231 Encounter for screening mammogram for malignant neoplasm of breast: Secondary | ICD-10-CM | POA: Diagnosis not present

## 2020-06-04 DIAGNOSIS — E559 Vitamin D deficiency, unspecified: Secondary | ICD-10-CM | POA: Diagnosis not present

## 2020-06-04 DIAGNOSIS — M810 Age-related osteoporosis without current pathological fracture: Secondary | ICD-10-CM | POA: Diagnosis not present

## 2020-06-11 DIAGNOSIS — Z23 Encounter for immunization: Secondary | ICD-10-CM | POA: Diagnosis not present

## 2020-09-01 DIAGNOSIS — S52502D Unspecified fracture of the lower end of left radius, subsequent encounter for closed fracture with routine healing: Secondary | ICD-10-CM | POA: Diagnosis not present

## 2020-09-11 DIAGNOSIS — M720 Palmar fascial fibromatosis [Dupuytren]: Secondary | ICD-10-CM | POA: Diagnosis not present

## 2020-09-11 DIAGNOSIS — L237 Allergic contact dermatitis due to plants, except food: Secondary | ICD-10-CM | POA: Diagnosis not present

## 2020-10-06 DIAGNOSIS — L918 Other hypertrophic disorders of the skin: Secondary | ICD-10-CM | POA: Diagnosis not present

## 2020-10-06 DIAGNOSIS — D225 Melanocytic nevi of trunk: Secondary | ICD-10-CM | POA: Diagnosis not present

## 2020-10-06 DIAGNOSIS — L821 Other seborrheic keratosis: Secondary | ICD-10-CM | POA: Diagnosis not present

## 2020-10-06 DIAGNOSIS — L812 Freckles: Secondary | ICD-10-CM | POA: Diagnosis not present

## 2020-10-06 DIAGNOSIS — Z85828 Personal history of other malignant neoplasm of skin: Secondary | ICD-10-CM | POA: Diagnosis not present

## 2020-10-06 DIAGNOSIS — L438 Other lichen planus: Secondary | ICD-10-CM | POA: Diagnosis not present

## 2020-10-17 DIAGNOSIS — L237 Allergic contact dermatitis due to plants, except food: Secondary | ICD-10-CM | POA: Diagnosis not present

## 2021-02-26 DIAGNOSIS — Z23 Encounter for immunization: Secondary | ICD-10-CM | POA: Diagnosis not present

## 2021-03-19 DIAGNOSIS — Z23 Encounter for immunization: Secondary | ICD-10-CM | POA: Diagnosis not present

## 2021-05-01 ENCOUNTER — Other Ambulatory Visit: Payer: Self-pay | Admitting: Family Medicine

## 2021-05-01 DIAGNOSIS — Z1231 Encounter for screening mammogram for malignant neoplasm of breast: Secondary | ICD-10-CM

## 2021-05-04 DIAGNOSIS — Z85828 Personal history of other malignant neoplasm of skin: Secondary | ICD-10-CM | POA: Diagnosis not present

## 2021-05-04 DIAGNOSIS — L404 Guttate psoriasis: Secondary | ICD-10-CM | POA: Diagnosis not present

## 2021-05-27 DIAGNOSIS — Z0000 Encounter for general adult medical examination without abnormal findings: Secondary | ICD-10-CM | POA: Diagnosis not present

## 2021-05-27 DIAGNOSIS — E559 Vitamin D deficiency, unspecified: Secondary | ICD-10-CM | POA: Diagnosis not present

## 2021-05-27 DIAGNOSIS — Z136 Encounter for screening for cardiovascular disorders: Secondary | ICD-10-CM | POA: Diagnosis not present

## 2021-05-27 DIAGNOSIS — M810 Age-related osteoporosis without current pathological fracture: Secondary | ICD-10-CM | POA: Diagnosis not present

## 2021-05-27 DIAGNOSIS — G540 Brachial plexus disorders: Secondary | ICD-10-CM | POA: Diagnosis not present

## 2021-05-27 DIAGNOSIS — H8109 Meniere's disease, unspecified ear: Secondary | ICD-10-CM | POA: Diagnosis not present

## 2021-06-04 ENCOUNTER — Ambulatory Visit
Admission: RE | Admit: 2021-06-04 | Discharge: 2021-06-04 | Disposition: A | Discharge status: Home / Self Care | Payer: Medicare Other | Source: Ambulatory Visit | Attending: Family Medicine | Admitting: Family Medicine

## 2021-06-04 DIAGNOSIS — Z1231 Encounter for screening mammogram for malignant neoplasm of breast: Secondary | ICD-10-CM

## 2021-10-07 DIAGNOSIS — H40013 Open angle with borderline findings, low risk, bilateral: Secondary | ICD-10-CM | POA: Diagnosis not present

## 2021-10-15 DIAGNOSIS — Z85828 Personal history of other malignant neoplasm of skin: Secondary | ICD-10-CM | POA: Diagnosis not present

## 2021-10-15 DIAGNOSIS — L245 Irritant contact dermatitis due to other chemical products: Secondary | ICD-10-CM | POA: Diagnosis not present

## 2021-10-15 DIAGNOSIS — D1801 Hemangioma of skin and subcutaneous tissue: Secondary | ICD-10-CM | POA: Diagnosis not present

## 2021-10-15 DIAGNOSIS — L821 Other seborrheic keratosis: Secondary | ICD-10-CM | POA: Diagnosis not present

## 2021-10-15 DIAGNOSIS — L812 Freckles: Secondary | ICD-10-CM | POA: Diagnosis not present

## 2021-12-01 DIAGNOSIS — H52223 Regular astigmatism, bilateral: Secondary | ICD-10-CM | POA: Diagnosis not present

## 2021-12-01 DIAGNOSIS — H524 Presbyopia: Secondary | ICD-10-CM | POA: Diagnosis not present

## 2022-04-16 DIAGNOSIS — R899 Unspecified abnormal finding in specimens from other organs, systems and tissues: Secondary | ICD-10-CM | POA: Diagnosis not present

## 2022-04-16 DIAGNOSIS — Z23 Encounter for immunization: Secondary | ICD-10-CM | POA: Diagnosis not present

## 2022-04-16 DIAGNOSIS — Z6825 Body mass index (BMI) 25.0-25.9, adult: Secondary | ICD-10-CM | POA: Diagnosis not present

## 2022-04-27 DIAGNOSIS — D649 Anemia, unspecified: Secondary | ICD-10-CM | POA: Diagnosis not present

## 2022-04-30 ENCOUNTER — Other Ambulatory Visit: Payer: Self-pay | Admitting: Family Medicine

## 2022-04-30 DIAGNOSIS — Z1231 Encounter for screening mammogram for malignant neoplasm of breast: Secondary | ICD-10-CM

## 2022-06-03 DIAGNOSIS — Z0000 Encounter for general adult medical examination without abnormal findings: Secondary | ICD-10-CM | POA: Diagnosis not present

## 2022-06-03 DIAGNOSIS — Z6825 Body mass index (BMI) 25.0-25.9, adult: Secondary | ICD-10-CM | POA: Diagnosis not present

## 2022-06-03 DIAGNOSIS — Z1389 Encounter for screening for other disorder: Secondary | ICD-10-CM | POA: Diagnosis not present

## 2022-06-08 DIAGNOSIS — E7800 Pure hypercholesterolemia, unspecified: Secondary | ICD-10-CM | POA: Diagnosis not present

## 2022-06-08 DIAGNOSIS — D649 Anemia, unspecified: Secondary | ICD-10-CM | POA: Diagnosis not present

## 2022-06-08 DIAGNOSIS — E559 Vitamin D deficiency, unspecified: Secondary | ICD-10-CM | POA: Diagnosis not present

## 2022-06-08 DIAGNOSIS — M810 Age-related osteoporosis without current pathological fracture: Secondary | ICD-10-CM | POA: Diagnosis not present

## 2022-06-08 DIAGNOSIS — H8109 Meniere's disease, unspecified ear: Secondary | ICD-10-CM | POA: Diagnosis not present

## 2022-06-08 DIAGNOSIS — R030 Elevated blood-pressure reading, without diagnosis of hypertension: Secondary | ICD-10-CM | POA: Diagnosis not present

## 2022-06-08 DIAGNOSIS — G540 Brachial plexus disorders: Secondary | ICD-10-CM | POA: Diagnosis not present

## 2022-06-25 ENCOUNTER — Ambulatory Visit
Admission: RE | Admit: 2022-06-25 | Discharge: 2022-06-25 | Disposition: A | Discharge status: Home / Self Care | Payer: No Typology Code available for payment source | Source: Ambulatory Visit | Attending: Family Medicine | Admitting: Family Medicine

## 2022-06-25 DIAGNOSIS — Z1231 Encounter for screening mammogram for malignant neoplasm of breast: Secondary | ICD-10-CM

## 2022-06-29 ENCOUNTER — Other Ambulatory Visit: Payer: Self-pay | Admitting: Family Medicine

## 2022-06-29 DIAGNOSIS — R928 Other abnormal and inconclusive findings on diagnostic imaging of breast: Secondary | ICD-10-CM

## 2022-07-05 ENCOUNTER — Ambulatory Visit
Admission: RE | Admit: 2022-07-05 | Discharge: 2022-07-05 | Disposition: A | Discharge status: Home / Self Care | Payer: PRIVATE HEALTH INSURANCE | Source: Ambulatory Visit | Attending: Family Medicine | Admitting: Family Medicine

## 2022-07-05 DIAGNOSIS — R928 Other abnormal and inconclusive findings on diagnostic imaging of breast: Secondary | ICD-10-CM

## 2022-07-05 DIAGNOSIS — N6489 Other specified disorders of breast: Secondary | ICD-10-CM | POA: Diagnosis not present

## 2022-07-06 ENCOUNTER — Other Ambulatory Visit: Payer: Self-pay | Admitting: Family Medicine

## 2022-07-06 DIAGNOSIS — N6489 Other specified disorders of breast: Secondary | ICD-10-CM

## 2022-07-09 ENCOUNTER — Ambulatory Visit
Admission: RE | Admit: 2022-07-09 | Discharge: 2022-07-09 | Disposition: A | Discharge status: Home / Self Care | Payer: PRIVATE HEALTH INSURANCE | Source: Ambulatory Visit | Attending: Family Medicine | Admitting: Family Medicine

## 2022-07-09 DIAGNOSIS — N6032 Fibrosclerosis of left breast: Secondary | ICD-10-CM | POA: Diagnosis not present

## 2022-07-09 DIAGNOSIS — N6489 Other specified disorders of breast: Secondary | ICD-10-CM

## 2022-07-09 DIAGNOSIS — R928 Other abnormal and inconclusive findings on diagnostic imaging of breast: Secondary | ICD-10-CM | POA: Diagnosis not present

## 2022-07-09 HISTORY — PX: BREAST BIOPSY: SHX20

## 2022-11-25 DIAGNOSIS — D2262 Melanocytic nevi of left upper limb, including shoulder: Secondary | ICD-10-CM | POA: Diagnosis not present

## 2022-11-25 DIAGNOSIS — Z85828 Personal history of other malignant neoplasm of skin: Secondary | ICD-10-CM | POA: Diagnosis not present

## 2022-11-25 DIAGNOSIS — L812 Freckles: Secondary | ICD-10-CM | POA: Diagnosis not present

## 2022-11-25 DIAGNOSIS — L820 Inflamed seborrheic keratosis: Secondary | ICD-10-CM | POA: Diagnosis not present

## 2022-11-25 DIAGNOSIS — L821 Other seborrheic keratosis: Secondary | ICD-10-CM | POA: Diagnosis not present

## 2022-12-15 DIAGNOSIS — S90921A Unspecified superficial injury of right foot, initial encounter: Secondary | ICD-10-CM | POA: Diagnosis not present

## 2022-12-15 DIAGNOSIS — T63461A Toxic effect of venom of wasps, accidental (unintentional), initial encounter: Secondary | ICD-10-CM | POA: Diagnosis not present

## 2022-12-20 DIAGNOSIS — H40013 Open angle with borderline findings, low risk, bilateral: Secondary | ICD-10-CM | POA: Diagnosis not present

## 2022-12-20 DIAGNOSIS — H524 Presbyopia: Secondary | ICD-10-CM | POA: Diagnosis not present

## 2022-12-31 DIAGNOSIS — R300 Dysuria: Secondary | ICD-10-CM | POA: Diagnosis not present

## 2022-12-31 DIAGNOSIS — Z6826 Body mass index (BMI) 26.0-26.9, adult: Secondary | ICD-10-CM | POA: Diagnosis not present

## 2022-12-31 DIAGNOSIS — N952 Postmenopausal atrophic vaginitis: Secondary | ICD-10-CM | POA: Diagnosis not present

## 2023-01-27 DIAGNOSIS — H52223 Regular astigmatism, bilateral: Secondary | ICD-10-CM | POA: Diagnosis not present

## 2023-01-27 DIAGNOSIS — H524 Presbyopia: Secondary | ICD-10-CM | POA: Diagnosis not present

## 2023-01-31 DIAGNOSIS — N952 Postmenopausal atrophic vaginitis: Secondary | ICD-10-CM | POA: Diagnosis not present

## 2023-01-31 DIAGNOSIS — R790 Abnormal level of blood mineral: Secondary | ICD-10-CM | POA: Diagnosis not present

## 2023-01-31 DIAGNOSIS — Z6826 Body mass index (BMI) 26.0-26.9, adult: Secondary | ICD-10-CM | POA: Diagnosis not present

## 2023-01-31 DIAGNOSIS — Z862 Personal history of diseases of the blood and blood-forming organs and certain disorders involving the immune mechanism: Secondary | ICD-10-CM | POA: Diagnosis not present

## 2023-03-14 DIAGNOSIS — Z6826 Body mass index (BMI) 26.0-26.9, adult: Secondary | ICD-10-CM | POA: Diagnosis not present

## 2023-03-14 DIAGNOSIS — H6120 Impacted cerumen, unspecified ear: Secondary | ICD-10-CM | POA: Diagnosis not present

## 2023-03-14 DIAGNOSIS — J069 Acute upper respiratory infection, unspecified: Secondary | ICD-10-CM | POA: Diagnosis not present

## 2023-03-21 DIAGNOSIS — H6123 Impacted cerumen, bilateral: Secondary | ICD-10-CM | POA: Diagnosis not present

## 2023-04-22 DIAGNOSIS — Z008 Encounter for other general examination: Secondary | ICD-10-CM | POA: Diagnosis not present

## 2023-04-22 DIAGNOSIS — E785 Hyperlipidemia, unspecified: Secondary | ICD-10-CM | POA: Diagnosis not present

## 2023-04-22 DIAGNOSIS — Z6824 Body mass index (BMI) 24.0-24.9, adult: Secondary | ICD-10-CM | POA: Diagnosis not present

## 2023-04-22 DIAGNOSIS — M810 Age-related osteoporosis without current pathological fracture: Secondary | ICD-10-CM | POA: Diagnosis not present

## 2023-05-30 ENCOUNTER — Other Ambulatory Visit: Payer: Self-pay | Admitting: Family Medicine

## 2023-05-30 DIAGNOSIS — Z1231 Encounter for screening mammogram for malignant neoplasm of breast: Secondary | ICD-10-CM

## 2023-06-06 DIAGNOSIS — Z0000 Encounter for general adult medical examination without abnormal findings: Secondary | ICD-10-CM | POA: Diagnosis not present

## 2023-06-06 DIAGNOSIS — Z6826 Body mass index (BMI) 26.0-26.9, adult: Secondary | ICD-10-CM | POA: Diagnosis not present

## 2023-06-06 DIAGNOSIS — Z1159 Encounter for screening for other viral diseases: Secondary | ICD-10-CM | POA: Diagnosis not present

## 2023-06-06 DIAGNOSIS — Z1211 Encounter for screening for malignant neoplasm of colon: Secondary | ICD-10-CM | POA: Diagnosis not present

## 2023-06-06 DIAGNOSIS — Z1331 Encounter for screening for depression: Secondary | ICD-10-CM | POA: Diagnosis not present

## 2023-06-10 DIAGNOSIS — Z1159 Encounter for screening for other viral diseases: Secondary | ICD-10-CM | POA: Diagnosis not present

## 2023-06-10 DIAGNOSIS — M810 Age-related osteoporosis without current pathological fracture: Secondary | ICD-10-CM | POA: Diagnosis not present

## 2023-06-10 DIAGNOSIS — E559 Vitamin D deficiency, unspecified: Secondary | ICD-10-CM | POA: Diagnosis not present

## 2023-06-10 DIAGNOSIS — Z6825 Body mass index (BMI) 25.0-25.9, adult: Secondary | ICD-10-CM | POA: Diagnosis not present

## 2023-06-10 DIAGNOSIS — H8109 Meniere's disease, unspecified ear: Secondary | ICD-10-CM | POA: Diagnosis not present

## 2023-06-10 DIAGNOSIS — E7800 Pure hypercholesterolemia, unspecified: Secondary | ICD-10-CM | POA: Diagnosis not present

## 2023-06-10 DIAGNOSIS — D649 Anemia, unspecified: Secondary | ICD-10-CM | POA: Diagnosis not present

## 2023-06-10 DIAGNOSIS — G540 Brachial plexus disorders: Secondary | ICD-10-CM | POA: Diagnosis not present

## 2023-06-23 DIAGNOSIS — Z1211 Encounter for screening for malignant neoplasm of colon: Secondary | ICD-10-CM | POA: Diagnosis not present

## 2023-06-28 ENCOUNTER — Ambulatory Visit
Admission: RE | Admit: 2023-06-28 | Discharge: 2023-06-28 | Disposition: A | Discharge status: Home / Self Care | Payer: No Typology Code available for payment source | Source: Ambulatory Visit | Attending: Family Medicine | Admitting: Family Medicine

## 2023-06-28 DIAGNOSIS — Z1231 Encounter for screening mammogram for malignant neoplasm of breast: Secondary | ICD-10-CM | POA: Diagnosis not present

## 2023-09-15 DIAGNOSIS — N952 Postmenopausal atrophic vaginitis: Secondary | ICD-10-CM | POA: Diagnosis not present

## 2023-09-15 DIAGNOSIS — R252 Cramp and spasm: Secondary | ICD-10-CM | POA: Diagnosis not present

## 2023-09-16 DIAGNOSIS — N763 Subacute and chronic vulvitis: Secondary | ICD-10-CM | POA: Diagnosis not present

## 2023-09-16 DIAGNOSIS — N898 Other specified noninflammatory disorders of vagina: Secondary | ICD-10-CM | POA: Diagnosis not present

## 2023-09-16 DIAGNOSIS — N952 Postmenopausal atrophic vaginitis: Secondary | ICD-10-CM | POA: Diagnosis not present

## 2023-10-14 DIAGNOSIS — N952 Postmenopausal atrophic vaginitis: Secondary | ICD-10-CM | POA: Diagnosis not present

## 2023-10-14 DIAGNOSIS — N763 Subacute and chronic vulvitis: Secondary | ICD-10-CM | POA: Diagnosis not present

## 2023-12-20 DIAGNOSIS — L821 Other seborrheic keratosis: Secondary | ICD-10-CM | POA: Diagnosis not present

## 2023-12-20 DIAGNOSIS — L738 Other specified follicular disorders: Secondary | ICD-10-CM | POA: Diagnosis not present

## 2023-12-20 DIAGNOSIS — Z85828 Personal history of other malignant neoplasm of skin: Secondary | ICD-10-CM | POA: Diagnosis not present

## 2023-12-20 DIAGNOSIS — D225 Melanocytic nevi of trunk: Secondary | ICD-10-CM | POA: Diagnosis not present
